# Patient Record
Sex: Male | Born: 1950 | Race: Black or African American | Hispanic: No | Smoking: Former smoker
Health system: Southern US, Community
[De-identification: ages and names within clinical notes are randomized; demographics above are authoritative.]

## PROBLEM LIST (undated history)

## (undated) DIAGNOSIS — E785 Hyperlipidemia, unspecified: Secondary | ICD-10-CM

## (undated) DIAGNOSIS — K4090 Unilateral inguinal hernia, without obstruction or gangrene, not specified as recurrent: Secondary | ICD-10-CM

## (undated) DIAGNOSIS — I10 Essential (primary) hypertension: Secondary | ICD-10-CM

## (undated) DIAGNOSIS — K219 Gastro-esophageal reflux disease without esophagitis: Secondary | ICD-10-CM

## (undated) DIAGNOSIS — Z8711 Personal history of peptic ulcer disease: Secondary | ICD-10-CM

## (undated) DIAGNOSIS — D649 Anemia, unspecified: Secondary | ICD-10-CM

## (undated) HISTORY — DX: Anemia, unspecified: D64.9

## (undated) HISTORY — DX: Hyperlipidemia, unspecified: E78.5

## (undated) HISTORY — PX: EYE SURGERY: SHX253

## (undated) HISTORY — DX: Essential (primary) hypertension: I10

## (undated) HISTORY — PX: GANGLION CYST EXCISION: SHX1691

---

## 2000-11-25 ENCOUNTER — Ambulatory Visit (HOSPITAL_COMMUNITY): Admission: RE | Admit: 2000-11-25 | Discharge: 2000-11-25 | Payer: Self-pay | Admitting: *Deleted

## 2002-05-25 ENCOUNTER — Inpatient Hospital Stay (HOSPITAL_COMMUNITY): Admission: EM | Admit: 2002-05-25 | Discharge: 2002-05-28 | Payer: Self-pay | Admitting: Emergency Medicine

## 2002-05-25 ENCOUNTER — Encounter: Payer: Self-pay | Admitting: Family Medicine

## 2014-12-11 NOTE — Progress Notes (Signed)
Please put orders in Epic surgery 12-27-14 pre op 12-20-14 Thanks

## 2014-12-12 ENCOUNTER — Ambulatory Visit: Payer: Self-pay | Admitting: General Surgery

## 2014-12-18 NOTE — Patient Instructions (Addendum)
YOUR PROCEDURE IS SCHEDULED ON : 12/27/14  REPORT TO [facility] MAIN ENTRANCE FOLLOW SIGNS TO EAST ELEVATOR - GO TO 3rd FLOOR CHECK IN AT 3 EAST NURSES STATION (SHORT STAY) AT: 7:30 AM  CALL THIS NUMBER IF YOU HAVE PROBLEMS THE MORNING OF SURGERY [phone]  REMEMBER:ONLY 1 PER PERSON MAY GO TO SHORT STAY WITH YOU TO GET READY THE MORNING OF YOUR SURGERY  DO NOT EAT FOOD OR DRINK LIQUIDS AFTER MIDNIGHT  TAKE THESE MEDICINES THE MORNING OF SURGERY: OMEPRAZOLE  STOP ASPIRIN / IBUPROFEN / ALEVE / VITAMINS / HERBAL MEDS __5__ DAYS BEFORE SURGERY  YOU MAY NOT HAVE ANY METAL ON YOUR BODY INCLUDING HAIR PINS AND PIERCING'S. DO NOT WEAR JEWELRY, MAKEUP, LOTIONS, POWDERS OR PERFUMES. DO NOT WEAR NAIL POLISH. DO NOT SHAVE 48 HRS PRIOR TO SURGERY. MEN MAY SHAVE FACE AND NECK.  DO NOT [place]. [facility] IS NOT RESPONSIBLE FOR VALUABLES.  CONTACTS, DENTURES OR PARTIALS MAY NOT BE WORN TO SURGERY. LEAVE SUITCASE IN CAR. CAN BE BROUGHT TO ROOM AFTER SURGERY.  PATIENTS DISCHARGED THE DAY OF SURGERY WILL NOT BE ALLOWED TO DRIVE HOME.  PLEASE READ OVER THE FOLLOWING INSTRUCTION SHEETS _________________________________________________________________________________                                          [place] - PREPARING FOR SURGERY  Before surgery, you can play an important role.  Because skin is not sterile, your skin needs to be as free of germs as possible.  You can reduce the number of germs on your skin by washing with CHG (chlorahexidine gluconate) soap before surgery.  CHG is an antiseptic cleaner which kills germs and bonds with the skin to continue killing germs even after washing. Please DO NOT use if you have an allergy to CHG or antibacterial soaps.  If your skin becomes reddened/irritated stop using the CHG and inform your nurse when you arrive at Short Stay. Do not shave (including legs and underarms) for at least 48 hours prior to  the first CHG shower.  You may shave your face. Please follow these instructions carefully:   1.  Shower with CHG Soap the night before surgery and the  morning of Surgery.   2.  If you choose to wash your hair, wash your hair first as usual with your  normal  Shampoo.   3.  After you shampoo, rinse your hair and body thoroughly to remove the  shampoo.                                         4.  Use CHG as you would any other liquid soap.  You can apply chg directly  to the skin and wash . Gently wash with scrungie or clean wascloth    5.  Apply the CHG Soap to your body ONLY FROM THE NECK DOWN.   Do not use on open                           Wound or open sores. Avoid contact with eyes, ears mouth and genitals (private parts).  Genitals (private parts) with your normal soap.              6.  Wash thoroughly, paying special attention to the area where your surgery  will be performed.   7.  Thoroughly rinse your body with warm water from the neck down.   8.  DO NOT shower/wash with your normal soap after using and rinsing off  the CHG Soap .                9.  Pat yourself dry with a clean towel.             10.  Wear clean night clothes to bed after shower             11.  Place clean sheets on your bed the night of your first shower and do not  sleep with pets.  Day of Surgery : Do not apply any lotions/deodorants the morning of surgery.  Please wear clean clothes to the hospital/surgery center.  FAILURE TO FOLLOW THESE INSTRUCTIONS MAY RESULT IN THE CANCELLATION OF YOUR SURGERY    PATIENT SIGNATURE_________________________________  ______________________________________________________________________

## 2014-12-20 ENCOUNTER — Encounter (HOSPITAL_COMMUNITY): Payer: Self-pay

## 2014-12-20 ENCOUNTER — Encounter (HOSPITAL_COMMUNITY)
Admission: RE | Admit: 2014-12-20 | Discharge: 2014-12-20 | Disposition: A | Discharge status: Home / Self Care | Payer: Medicare Other | Source: Ambulatory Visit | Attending: General Surgery | Admitting: General Surgery

## 2014-12-20 DIAGNOSIS — Z01818 Encounter for other preprocedural examination: Secondary | ICD-10-CM | POA: Diagnosis present

## 2014-12-20 DIAGNOSIS — K4090 Unilateral inguinal hernia, without obstruction or gangrene, not specified as recurrent: Secondary | ICD-10-CM | POA: Diagnosis not present

## 2014-12-20 HISTORY — DX: Unilateral inguinal hernia, without obstruction or gangrene, not specified as recurrent: K40.90

## 2014-12-20 HISTORY — DX: Personal history of peptic ulcer disease: Z87.11

## 2014-12-20 HISTORY — DX: Gastro-esophageal reflux disease without esophagitis: K21.9

## 2014-12-20 LAB — CBC
HEMATOCRIT: 40 % (ref 39.0–52.0)
HEMOGLOBIN: 13.6 g/dL (ref 13.0–17.0)
MCH: 30.6 pg (ref 26.0–34.0)
MCHC: 34 g/dL (ref 30.0–36.0)
MCV: 89.9 fL (ref 78.0–100.0)
Platelets: 154 10*3/uL (ref 150–400)
RBC: 4.45 MIL/uL (ref 4.22–5.81)
RDW: 12.9 % (ref 11.5–15.5)
WBC: 3.7 10*3/uL — ABNORMAL LOW (ref 4.0–10.5)

## 2014-12-27 ENCOUNTER — Ambulatory Visit (HOSPITAL_COMMUNITY)
Admission: RE | Admit: 2014-12-27 | Discharge: 2014-12-27 | Disposition: A | Discharge status: Home / Self Care | Payer: Medicare Other | Source: Ambulatory Visit | Attending: General Surgery | Admitting: General Surgery

## 2014-12-27 ENCOUNTER — Encounter (HOSPITAL_COMMUNITY): Payer: Self-pay | Admitting: *Deleted

## 2014-12-27 ENCOUNTER — Encounter (HOSPITAL_COMMUNITY)
Admission: RE | Disposition: A | Discharge status: Home / Self Care | Payer: Self-pay | Source: Ambulatory Visit | Attending: General Surgery

## 2014-12-27 ENCOUNTER — Ambulatory Visit (HOSPITAL_COMMUNITY): Payer: Medicare Other | Admitting: Anesthesiology

## 2014-12-27 DIAGNOSIS — Z79899 Other long term (current) drug therapy: Secondary | ICD-10-CM | POA: Insufficient documentation

## 2014-12-27 DIAGNOSIS — K219 Gastro-esophageal reflux disease without esophagitis: Secondary | ICD-10-CM | POA: Insufficient documentation

## 2014-12-27 DIAGNOSIS — K4090 Unilateral inguinal hernia, without obstruction or gangrene, not specified as recurrent: Secondary | ICD-10-CM | POA: Diagnosis present

## 2014-12-27 DIAGNOSIS — Z87891 Personal history of nicotine dependence: Secondary | ICD-10-CM | POA: Diagnosis not present

## 2014-12-27 HISTORY — PX: INGUINAL HERNIA REPAIR: SHX194

## 2014-12-27 HISTORY — PX: INSERTION OF MESH: SHX5868

## 2014-12-27 SURGERY — REPAIR, HERNIA, INGUINAL, LAPAROSCOPIC
Anesthesia: General | Site: Abdomen | Laterality: Right

## 2014-12-27 MED ORDER — CHLORHEXIDINE GLUCONATE 4 % EX LIQD: 1.0000 "application " | Freq: Once | CUTANEOUS | Status: DC

## 2014-12-27 MED ORDER — FENTANYL CITRATE (PF) 100 MCG/2ML IJ SOLN
INTRAMUSCULAR | Status: DC | PRN
  Administered 2014-12-27 (×3): 50 ug via INTRAVENOUS
  Administered 2014-12-27: 100 ug via INTRAVENOUS

## 2014-12-27 MED ORDER — ACETAMINOPHEN 325 MG PO TABS: 650.0000 mg | ORAL_TABLET | ORAL | Status: DC | PRN

## 2014-12-27 MED ORDER — SODIUM CHLORIDE 0.9 % IV SOLN: 250.0000 mL | INTRAVENOUS | Status: DC | PRN

## 2014-12-27 MED ORDER — PROMETHAZINE HCL 25 MG/ML IJ SOLN
6.2500 mg | INTRAMUSCULAR | Status: DC | PRN
Start: 2014-12-27 — End: 2014-12-27

## 2014-12-27 MED ORDER — MIDAZOLAM HCL 2 MG/2ML IJ SOLN
INTRAMUSCULAR | Status: AC
  Filled 2014-12-27: qty 4

## 2014-12-27 MED ORDER — 0.9 % SODIUM CHLORIDE (POUR BTL) OPTIME
TOPICAL | Status: DC | PRN
  Administered 2014-12-27: 1000 mL

## 2014-12-27 MED ORDER — KETOROLAC TROMETHAMINE 30 MG/ML IJ SOLN: 30.0000 mg | Freq: Once | INTRAMUSCULAR | Status: DC

## 2014-12-27 MED ORDER — CEFAZOLIN SODIUM-DEXTROSE 2-3 GM-% IV SOLR
2.0000 g | INTRAVENOUS | Status: AC
  Administered 2014-12-27: 2 g via INTRAVENOUS

## 2014-12-27 MED ORDER — SUGAMMADEX SODIUM 500 MG/5ML IV SOLN
INTRAVENOUS | Status: DC | PRN
  Administered 2014-12-27: 400 mg via INTRAVENOUS

## 2014-12-27 MED ORDER — SODIUM CHLORIDE 0.9 % IJ SOLN: 3.0000 mL | INTRAMUSCULAR | Status: DC | PRN

## 2014-12-27 MED ORDER — HYDROMORPHONE HCL 1 MG/ML IJ SOLN: 0.2500 mg | INTRAMUSCULAR | Status: DC | PRN

## 2014-12-27 MED ORDER — SODIUM CHLORIDE 0.9 % IJ SOLN: 3.0000 mL | Freq: Two times a day (BID) | INTRAMUSCULAR | Status: DC

## 2014-12-27 MED ORDER — MIDAZOLAM HCL 5 MG/5ML IJ SOLN
INTRAMUSCULAR | Status: DC | PRN
  Administered 2014-12-27: 2 mg via INTRAVENOUS

## 2014-12-27 MED ORDER — DEXAMETHASONE SODIUM PHOSPHATE 10 MG/ML IJ SOLN
INTRAMUSCULAR | Status: AC
  Filled 2014-12-27: qty 1

## 2014-12-27 MED ORDER — MORPHINE SULFATE (PF) 10 MG/ML IV SOLN: 1.0000 mg | INTRAVENOUS | Status: DC | PRN

## 2014-12-27 MED ORDER — DEXAMETHASONE SODIUM PHOSPHATE 10 MG/ML IJ SOLN
INTRAMUSCULAR | Status: DC | PRN
  Administered 2014-12-27: 10 mg via INTRAVENOUS

## 2014-12-27 MED ORDER — SODIUM CHLORIDE 0.9 % IV SOLN: INTRAVENOUS | Status: DC

## 2014-12-27 MED ORDER — CEFAZOLIN SODIUM-DEXTROSE 2-3 GM-% IV SOLR
INTRAVENOUS | Status: AC
  Filled 2014-12-27: qty 50

## 2014-12-27 MED ORDER — SUGAMMADEX SODIUM 500 MG/5ML IV SOLN
INTRAVENOUS | Status: AC
  Filled 2014-12-27: qty 5

## 2014-12-27 MED ORDER — ACETAMINOPHEN 650 MG RE SUPP
650.0000 mg | RECTAL | Status: DC | PRN
Start: 2014-12-27 — End: 2014-12-27
  Filled 2014-12-27: qty 1

## 2014-12-27 MED ORDER — OXYCODONE HCL 5 MG PO TABS: 5.0000 mg | ORAL_TABLET | ORAL | Status: DC | PRN

## 2014-12-27 MED ORDER — GLYCOPYRROLATE 0.2 MG/ML IJ SOLN
INTRAMUSCULAR | Status: AC
  Filled 2014-12-27: qty 3

## 2014-12-27 MED ORDER — ONDANSETRON HCL 4 MG/2ML IJ SOLN
INTRAMUSCULAR | Status: AC
  Filled 2014-12-27: qty 2

## 2014-12-27 MED ORDER — LIDOCAINE HCL (PF) 2 % IJ SOLN
INTRAMUSCULAR | Status: DC | PRN
  Administered 2014-12-27: 30 mg via INTRADERMAL

## 2014-12-27 MED ORDER — FENTANYL CITRATE (PF) 250 MCG/5ML IJ SOLN
INTRAMUSCULAR | Status: AC
  Filled 2014-12-27: qty 25

## 2014-12-27 MED ORDER — BUPIVACAINE-EPINEPHRINE 0.25% -1:200000 IJ SOLN
INTRAMUSCULAR | Status: DC | PRN
  Administered 2014-12-27: 50 mL

## 2014-12-27 MED ORDER — PROPOFOL 10 MG/ML IV BOLUS
INTRAVENOUS | Status: DC | PRN
Start: 2014-12-27 — End: 2014-12-27
  Administered 2014-12-27: 150 mg via INTRAVENOUS

## 2014-12-27 MED ORDER — PROPOFOL 10 MG/ML IV BOLUS
INTRAVENOUS | Status: AC
  Filled 2014-12-27: qty 20

## 2014-12-27 MED ORDER — LACTATED RINGERS IV SOLN
INTRAVENOUS | Status: DC
  Administered 2014-12-27: 09:00:00 via INTRAVENOUS

## 2014-12-27 MED ORDER — ROCURONIUM BROMIDE 100 MG/10ML IV SOLN
INTRAVENOUS | Status: DC | PRN
  Administered 2014-12-27: 50 mg via INTRAVENOUS
  Administered 2014-12-27 (×2): 10 mg via INTRAVENOUS

## 2014-12-27 MED ORDER — BUPIVACAINE-EPINEPHRINE (PF) 0.25% -1:200000 IJ SOLN
INTRAMUSCULAR | Status: AC
  Filled 2014-12-27: qty 60

## 2014-12-27 SURGICAL SUPPLY — 41 items
ADH SKN CLS APL DERMABOND .7 (GAUZE/BANDAGES/DRESSINGS) ×1
APL SKNCLS STERI-STRIP NONHPOA (GAUZE/BANDAGES/DRESSINGS)
BANDAGE ADH SHEER 1  50/CT (GAUZE/BANDAGES/DRESSINGS) IMPLANT
BENZOIN TINCTURE PRP APPL 2/3 (GAUZE/BANDAGES/DRESSINGS) IMPLANT
CABLE HIGH FREQUENCY MONO STRZ (ELECTRODE) ×1 IMPLANT
COVER SURGICAL LIGHT HANDLE (MISCELLANEOUS) ×2 IMPLANT
DECANTER SPIKE VIAL GLASS SM (MISCELLANEOUS) ×2 IMPLANT
DERMABOND ADVANCED (GAUZE/BANDAGES/DRESSINGS) ×1
DERMABOND ADVANCED .7 DNX12 (GAUZE/BANDAGES/DRESSINGS) IMPLANT
DEVICE SECURE STRAP 25 ABSORB (INSTRUMENTS) ×1 IMPLANT
DRAPE CAMERA CLOSED 9X96 (DRAPES) ×1 IMPLANT
DRAPE LAPAROSCOPIC ABDOMINAL (DRAPES) ×2 IMPLANT
DRSG TEGADERM 2-3/8X2-3/4 SM (GAUZE/BANDAGES/DRESSINGS) IMPLANT
DRSG TEGADERM 4X4.75 (GAUZE/BANDAGES/DRESSINGS) IMPLANT
ELECT REM PT RETURN 9FT ADLT (ELECTROSURGICAL) ×2
ELECTRODE REM PT RTRN 9FT ADLT (ELECTROSURGICAL) ×1 IMPLANT
GLOVE BIO SURGEON STRL SZ7.5 (GLOVE) ×2 IMPLANT
GLOVE BIOGEL M STRL SZ7.5 (GLOVE) IMPLANT
GLOVE INDICATOR 8.0 STRL GRN (GLOVE) ×2 IMPLANT
GOWN STRL REUS W/TWL XL LVL3 (GOWN DISPOSABLE) ×6 IMPLANT
KIT BASIN OR (CUSTOM PROCEDURE TRAY) ×2 IMPLANT
MESH 3DMAX LIGHT 4.1X6.2 RT LR (Mesh General) ×1 IMPLANT
NS IRRIG 1000ML POUR BTL (IV SOLUTION) ×2 IMPLANT
RELOAD STAPLE 4.0 BLU F/HERNIA (INSTRUMENTS) IMPLANT
RELOAD STAPLE 4.8 BLK F/HERNIA (STAPLE) IMPLANT
RELOAD STAPLE HERNIA 4.0 BLUE (INSTRUMENTS) IMPLANT
RELOAD STAPLE HERNIA 4.8 BLK (STAPLE) IMPLANT
SCALPEL HARMONIC ACE (MISCELLANEOUS) IMPLANT
SCISSORS LAP 5X35 DISP (ENDOMECHANICALS) ×1 IMPLANT
SET IRRIG TUBING LAPAROSCOPIC (IRRIGATION / IRRIGATOR) IMPLANT
SOLUTION ANTI FOG 6CC (MISCELLANEOUS) ×1 IMPLANT
STAPLER HERNIA 12 8.5 360D (INSTRUMENTS) ×2 IMPLANT
STRIP CLOSURE SKIN 1/2X4 (GAUZE/BANDAGES/DRESSINGS) IMPLANT
SUT MNCRL AB 4-0 PS2 18 (SUTURE) ×2 IMPLANT
TOWEL OR 17X26 10 PK STRL BLUE (TOWEL DISPOSABLE) ×2 IMPLANT
TOWEL OR NON WOVEN STRL DISP B (DISPOSABLE) ×2 IMPLANT
TRAY LAPAROSCOPIC (CUSTOM PROCEDURE TRAY) ×2 IMPLANT
TROCAR BLADELESS OPT 5 75 (ENDOMECHANICALS) ×2 IMPLANT
TROCAR SLEEVE XCEL 5X75 (ENDOMECHANICALS) ×2 IMPLANT
TROCAR XCEL BLUNT TIP 100MML (ENDOMECHANICALS) ×2 IMPLANT
TUBING INSUFFLATION 10FT LAP (TUBING) ×2 IMPLANT

## 2014-12-27 NOTE — Op Note (Signed)
12/27/2014  [place]. Jul 27, 1950   PREOPERATIVE DIAGNOSIS: right inguinal hernia.   POSTOPERATIVE DIAGNOSIS: large right indirect inguinal hernia.   PROCEDURE: Laparoscopic repair of large right indirect inguinal hernia with  mesh (TAPP).   SURGEON: Leighton Ruff. Redmond Pulling, MD   ASSISTANT SURGEON: None.   ANESTHESIA: General plus local consisting of 0.25% Marcaine with epi.   ESTIMATED BLOOD LOSS: Minimal.   FINDINGS: The patient had a large right indirect inguinal hernia with large sac.  It was repaired using a right Large 3DMax Bard Mesh  SPECIMEN: none  INDICATIONS FOR PROCEDURE: 64 yo male with symptomatic right inguinal hernia who desired repair. Please see h&p for addl details.  The risks and benefits including but not limited to bleeding, infection, chronic inguinal pain, nerve entrapment, hernia recurrence, mesh complications, hematoma formation, urinary retention, injury to the testicles or the ovaries, numbness in the groin, blood clots, injury to the surrounding structures, and anesthesia risk was discussed with the patient.  DESCRIPTION OF PROCEDURE: After obtaining verbal consent and marking  the right groin in the holding area with the patient confirming the  operative site, the patient was then taken back to the operating room, placed  supine on the operating room table. General endotracheal anesthesia was  established. The patient had emptied their bladder prior to going back to  the operating room. Sequential compression devices were placed. The  abdomen and groin were prepped and draped in the usual standard surgical  fashion with ChloraPrep. The patient received IV  antibiotics prior to the incision. A surgical time-out was performed.  Local was infiltrated at the base of the umbilicus.   Next, a 1-cm vertical infraumbilical incision was made with a #11 blade. The fascia  was grasped and lifted anteriorly. Next, the fascia was incised, and  the abdominal  cavity was entered. Pursestring suture was placed around  the fascial edges using a 0 Vicryl. A 12-mm Hasson trocar was placed.  Pneumoperitoneum was smoothly established up to a patient pressure of 15  mmHg. Laparoscope was advanced. There was no evidence of a  contralateral hernia. The patient had a defect lateral to  the inferior epigastric vessel, consistent with an right indirect  hernia. Two 5-mm trocars were placed, one on the right, one on the left  in the midclavicular line slightly above the level of the umbilicus all  under direct visualization. After local had been infiltrated, I then  made incision along the peritoneum on the right, starting 2 inches above  the anterior superior iliac spine and caring it medial  toward the median umbilical ligament in a lazy S configuration using  Endo Shears with electrocautery. The peritoneal flap was then gently  dissected downward from the anterior abdominal wall taking care not to  injure the inferior epigastric vessels. He had somewhat of a convexity to his lower abdominal wall that made it a little challenging to see where the upper portion of the abdominal wall where the flap had been taken down from. The pubic bone was identified. The testicular vessels were identified.  Using  traction and counter traction with short graspers, I reduced the sac in  its entirety. It took about 25 minutes to reduce this large sac. The testicular vessels had been identified and preserved. The vas deferens was identified and preserved, and the hernia sac was stripped from those to  surrounding structures. I then went about creating a large pocket by  lifting the peritoneum of the pelvic floor. I  took great care not to  injure the iliac vessels.    Local anesthetic was injected 2 finger breadths below and medial to the anterior superior iliac spine as well as along the right groin prior to placing the mesh. I then obtained a large piece of Bard 3DMax mesh,  placed it through the Hasson trocar, half of it covered medial  to the inferior epigastric vessels and half of it lateral to the  inferior epigastric vessels. The defect was well  covered with the mesh. Again because of the shape of his lower abdominal wall, I had to change the camera angle several times to see upper edge of mesh.  I then secured the mesh to the abdominal wall  using an Ethicon secure strap tack. Tacks were placed through on upper medial side of mesh, one tack on each side of the inferior epigastric  vessel and two tacks out laterally. No tacks were placed below the  shelving edge of the inguinal ligament. Pneumoperitoneum was reduced  to 8 mmHg. I then brought the peritoneal flap back up to the abdominal  wall and tacked it to the abdominal wall using 3 tacks. There was no  defect in the peritoneum, and the mesh was well covered. I removed the  Hasson trocar and tied down the previously placed pursestring suture.  The closure was viewed laparoscopically. There was no evidence of  fascial defect. There was no air leak at the umbilicus. There was no  evidence of injury to surrounding structures. Pneumoperitoneum was  released, and the remaining trocars were removed. All skin incisions  were closed with a 4-0 Monocryl in a subcuticular fashion followed by  application of Dermabond. All needle, instrument, and sponge counts  were correct x2. There are no immediate complications. The patient  tolerated the procedure well. The patient was extubated and taken to the  recovery room in stable condition.  Leighton Ruff. Redmond Pulling, MD, FACS General, Bariatric, & Minimally Invasive Surgery [facility], [place]

## 2014-12-27 NOTE — Anesthesia Postprocedure Evaluation (Signed)
  Anesthesia Post-op Note  Patient: Austin Owens.  Procedure(s) Performed: Procedure(s) (LRB): LAPAROSCOPIC RIGHT INGUINAL HERNIA REPAIR WITH MESH (Right) INSERTION OF MESH (Right)  Patient Location: PACU  Anesthesia Type: General  Level of Consciousness: awake and alert   Airway and Oxygen Therapy: Patient Spontanous Breathing  Post-op Pain: mild  Post-op Assessment: Post-op Vital signs reviewed, Patient's Cardiovascular Status Stable, Respiratory Function Stable, Patent Airway and No signs of Nausea or vomiting  Last Vitals:  Filed Vitals:   12/27/14 1202  BP: 132/81  Pulse: 72  Temp: 36.6 C  Resp: 16    Post-op Vital Signs: stable   Complications: No apparent anesthesia complications

## 2014-12-27 NOTE — H&P (Signed)
Austin Dash. is an 64 y.o. male.   Chief Complaint: here for surgery HPI: The patient is a 64 year old male who presents with an inguinal hernia. He is referred by Dr Criss Rosales for evaluation of an inguinal hernia. He states that it only has been present for a couple weeks. It does cause some intermittent discomfort but he denies any sharp, burning, stinging pain. He denies any nausea or vomiting. He reports daily bowel movements. He denies any difficulty urinating. He denies any pain with urination. He is quite talkative and verbose. He denies any unexplained weight change. He denies any chest pain, chest pressure, shortness of breath, dyspnea on exertion TIAs, or amaurosis fugax.  He has been taking his flomax except for this am. He denies any changes since I saw him in the clinic.  Past Medical History  Diagnosis Date  . History of bleeding ulcers     x3  . GERD (gastroesophageal reflux disease)   . Inguinal hernia     Past Surgical History  Procedure Laterality Date  . Eye surgery      x7  . Ganglion cyst excision      left hand    History reviewed. No pertinent family history. Social History:  reports that he quit smoking about 45 years ago. He does not have any smokeless tobacco history on file. He reports that he does not drink alcohol or use illicit drugs.  Allergies: No Known Allergies  Medications Prior to Admission  Medication Sig Dispense Refill  . acetaminophen (TYLENOL) 500 MG tablet Take 500 mg by mouth every 6 (six) hours as needed for mild pain or headache.    . Multiple Vitamin (MULTIVITAMIN WITH MINERALS) TABS tablet Take 1 tablet by mouth daily.    . naproxen sodium (ANAPROX) 220 MG tablet Take 220 mg by mouth 2 (two) times daily with a meal.    . omeprazole (PRILOSEC) 20 MG capsule Take 20 mg by mouth daily.    . tamsulosin (FLOMAX) 0.4 MG CAPS capsule TAKE ONE CAPSULE BY MOUTH EVERY DAY, START TAKING 2 WEEKS BEFORE SURGERY  0    No results found for  this or any previous visit (from the past 77 hour(s)). No results found.  Review of Systems  Constitutional: Negative for weight loss.  HENT: Negative for nosebleeds.   Eyes: Negative for blurred vision.  Respiratory: Negative for shortness of breath.   Cardiovascular: Negative for chest pain, palpitations, orthopnea and PND.       Denies DOE  Genitourinary: Negative for dysuria and hematuria.  Musculoskeletal: Negative.   Skin: Negative for itching and rash.  Neurological: Negative for dizziness, focal weakness, seizures, loss of consciousness and headaches.       Denies TIAs, amaurosis fugax  Endo/Heme/Allergies: Does not bruise/bleed easily.  Psychiatric/Behavioral: The patient is not nervous/anxious.     Blood pressure 139/85, pulse 74, temperature 98.1 F (36.7 C), temperature source Oral, resp. rate 18, height 5' 11.75" (1.822 m), weight 98.431 kg (217 lb), SpO2 100 %. Physical Exam  Vitals reviewed. Constitutional: He is oriented to person, place, and time. He appears well-developed and well-nourished. No distress.  HENT:  Head: Normocephalic and atraumatic.  Right Ear: External ear normal.  Left Ear: External ear normal.  Eyes: Conjunctivae are normal. No scleral icterus.  Neck: Normal range of motion. Neck supple. No tracheal deviation present. No thyromegaly present.  Cardiovascular: Normal rate and normal heart sounds.   Respiratory: Effort normal and breath sounds normal.  No stridor. No respiratory distress. He has no wheezes.  GI: Soft. He exhibits no distension. There is no tenderness. There is no rebound.  Genitourinary:  Right groin bulge  Musculoskeletal: He exhibits no edema or tenderness.  Lymphadenopathy:    He has no cervical adenopathy.  Neurological: He is alert and oriented to person, place, and time. He exhibits normal muscle tone.  Skin: Skin is warm and dry. No rash noted. He is not diaphoretic. No erythema. No pallor.  Psychiatric: He has a normal  mood and affect. His behavior is normal. Judgment and thought content normal.     Assessment/Plan Right inguinal hernia  To OR for lap repair of RIH with mesh All questions asked and answered.   Leighton Ruff. Redmond Pulling, MD, FACS General, Bariatric, & Minimally Invasive Surgery [facility], [place]   [facility] M 12/27/2014, 7:25 AM

## 2014-12-27 NOTE — Anesthesia Procedure Notes (Signed)
Procedure Name: Intubation Date/Time: 12/27/2014 9:40 AM Performed by: Lajuana Carry E Pre-anesthesia Checklist: Patient identified, Emergency Drugs available, Suction available and Patient being monitored Patient Re-evaluated:Patient Re-evaluated prior to inductionOxygen Delivery Method: Circle System Utilized Preoxygenation: Pre-oxygenation with 100% oxygen Intubation Type: IV induction Ventilation: Mask ventilation without difficulty Laryngoscope Size: Miller and 3 Grade View: Grade I Tube type: Oral Number of attempts: 1 Airway Equipment and Method: Stylet Placement Confirmation: ETT inserted through vocal cords under direct vision,  positive ETCO2 and breath sounds checked- equal and bilateral Secured at: 23 cm Tube secured with: Tape Dental Injury: Teeth and Oropharynx as per pre-operative assessment

## 2014-12-27 NOTE — Anesthesia Preprocedure Evaluation (Signed)
Anesthesia Evaluation  Patient identified by MRN, date of birth, ID band Patient awake    Reviewed: Allergy & Precautions, NPO status , Patient's Chart, lab work & pertinent test results  Airway Mallampati: II  TM Distance: >3 FB Neck ROM: Full    Dental no notable dental hx.    Pulmonary neg pulmonary ROS, former smoker,    Pulmonary exam normal breath sounds clear to auscultation       Cardiovascular negative cardio ROS Normal cardiovascular exam Rhythm:Regular Rate:Normal     Neuro/Psych negative neurological ROS  negative psych ROS   GI/Hepatic Neg liver ROS, GERD  Medicated,  Endo/Other  negative endocrine ROS  Renal/GU negative Renal ROS  negative genitourinary   Musculoskeletal negative musculoskeletal ROS (+)   Abdominal   Peds negative pediatric ROS (+)  Hematology negative hematology ROS (+)   Anesthesia Other Findings   Reproductive/Obstetrics negative OB ROS                             Anesthesia Physical Anesthesia Plan  ASA: II  Anesthesia Plan: General   Post-op Pain Management:    Induction: Intravenous  Airway Management Planned: Oral ETT  Additional Equipment:   Intra-op Plan:   Post-operative Plan: Extubation in OR  Informed Consent: I have reviewed the patients History and Physical, chart, labs and discussed the procedure including the risks, benefits and alternatives for the proposed anesthesia with the patient or authorized representative who has indicated his/her understanding and acceptance.   Dental advisory given  Plan Discussed with: CRNA and Surgeon  Anesthesia Plan Comments:         Anesthesia Quick Evaluation

## 2014-12-27 NOTE — Discharge Instructions (Signed)
[facility] ° °UMBILICAL OR INGUINAL HERNIA REPAIR: POST OP INSTRUCTIONS ° °Always review your discharge instruction sheet given to you by the facility where your surgery was performed. °IF YOU HAVE DISABILITY OR FAMILY LEAVE FORMS, YOU MUST BRING THEM TO THE OFFICE FOR PROCESSING.   °DO NOT GIVE THEM TO YOUR DOCTOR. ° °1. A  prescription for pain medication may be given to you upon discharge.  Take your pain medication as prescribed, if needed.  If narcotic pain medicine is not needed, then you may take acetaminophen (Tylenol) or ibuprofen (Advil) as needed. °2. Take your usually prescribed medications unless otherwise directed. °3. If you need a refill on your pain medication, please contact your pharmacy.  They will contact our office to request authorization. Prescriptions will not be filled after 5 pm or on week-ends. °4. You should follow a light diet the first 24 hours after arrival home, such as soup and crackers, etc.  Be sure to include lots of fluids daily.  Resume your normal diet the day after surgery. °5. Most patients will experience some swelling and bruising around the umbilicus or in the groin and scrotum.  Ice packs and reclining will help.  Swelling and bruising can take several days to resolve.  °6. It is common to experience some constipation if taking pain medication after surgery.  Increasing fluid intake and taking a stool softener (such as Colace) will usually help or prevent this problem from occurring.  A mild laxative (Milk of Magnesia or Miralax) should be taken according to package directions if there are no bowel movements after 48 hours. °7.   If your surgeon used skin glue on the incision, you may shower in 24 hours.  The glue will flake off over the next 2-3 weeks.  Any sutures or staples will be removed at the office during your follow-up visit. °8. ACTIVITIES:  You may resume regular (light) daily activities beginning the next day--such as daily self-care,  walking, climbing stairs--gradually increasing activities as tolerated.  You may have sexual intercourse when it is comfortable.  Refrain from any heavy lifting or straining until approved by your doctor. °a. You may drive when you are no longer taking prescription pain medication, you can comfortably wear a seatbelt, and you can safely maneuver your car and apply brakes. °b. RETURN TO WORK:  °9. You should see your doctor in the office for a follow-up appointment approximately 2-3 weeks after your surgery.  Make sure that you call for this appointment within a day or two after you arrive home to insure a convenient appointment time. °10. OTHER INSTRUCTIONS: DO NOT LIFT, PUSH, OR PULL ANYTHING GREATER THAN 10 POUNDS FOR 6 WEEKS  ° ° °WHEN TO CALL YOUR DOCTOR: °1. Fever over 101.0 °2. Inability to urinate °3. Nausea and/or vomiting °4. Extreme swelling or bruising °5. Continued bleeding from incision. °6. Increased pain, redness, or drainage from the incision ° °The clinic staff is available to answer your questions during regular business hours.  Please don’t hesitate to call and ask to speak to one of the nurses for clinical concerns.  If you have a medical emergency, go to the nearest emergency room or call 911.  A surgeon from [facility] is always on call at the hospital ° ° °[address], [address], [place], [place]  [address] ? ° [address] [address], [place],    [address] °[phone] ? [phone] ? [phone] [phone] °Web site: [email or web address] ° °

## 2014-12-27 NOTE — Transfer of Care (Signed)
Immediate Anesthesia Transfer of Care Note  Patient: Austin Owens.  Procedure(s) Performed: Procedure(s): LAPAROSCOPIC RIGHT INGUINAL HERNIA REPAIR WITH MESH (Right) INSERTION OF MESH (Right)  Patient Location: PACU  Anesthesia Type:General  Level of Consciousness:  sedated, patient cooperative and responds to stimulation  Airway & Oxygen Therapy:Patient Spontanous Breathing and Patient connected to face mask oxgen  Post-op Assessment:  Report given to PACU RN and Post -op Vital signs reviewed and stable  Post vital signs:  Reviewed and stable  Last Vitals:  Filed Vitals:   12/27/14 0653  BP: 139/85  Pulse: 74  Temp: 36.7 C  Resp: 18    Complications: No apparent anesthesia complications

## 2015-03-27 ENCOUNTER — Other Ambulatory Visit: Payer: Self-pay | Admitting: General Surgery

## 2015-03-27 DIAGNOSIS — S301XXS Contusion of abdominal wall, sequela: Secondary | ICD-10-CM

## 2015-03-29 ENCOUNTER — Ambulatory Visit
Admission: RE | Admit: 2015-03-29 | Discharge: 2015-03-29 | Disposition: A | Discharge status: Home / Self Care | Payer: Medicare Other | Source: Ambulatory Visit | Attending: General Surgery | Admitting: General Surgery

## 2015-03-29 DIAGNOSIS — M7981 Nontraumatic hematoma of soft tissue: Secondary | ICD-10-CM | POA: Diagnosis not present

## 2015-03-29 DIAGNOSIS — S301XXS Contusion of abdominal wall, sequela: Secondary | ICD-10-CM

## 2015-05-02 DIAGNOSIS — L80 Vitiligo: Secondary | ICD-10-CM | POA: Diagnosis not present

## 2015-07-02 DIAGNOSIS — Z6832 Body mass index (BMI) 32.0-32.9, adult: Secondary | ICD-10-CM | POA: Diagnosis not present

## 2015-07-02 DIAGNOSIS — R7309 Other abnormal glucose: Secondary | ICD-10-CM | POA: Diagnosis not present

## 2015-07-02 DIAGNOSIS — F99 Mental disorder, not otherwise specified: Secondary | ICD-10-CM | POA: Diagnosis not present

## 2015-07-02 DIAGNOSIS — R5383 Other fatigue: Secondary | ICD-10-CM | POA: Diagnosis not present

## 2015-07-15 DIAGNOSIS — K219 Gastro-esophageal reflux disease without esophagitis: Secondary | ICD-10-CM | POA: Diagnosis not present

## 2015-07-15 DIAGNOSIS — R1031 Right lower quadrant pain: Secondary | ICD-10-CM | POA: Diagnosis not present

## 2015-07-15 DIAGNOSIS — Z1211 Encounter for screening for malignant neoplasm of colon: Secondary | ICD-10-CM | POA: Diagnosis not present

## 2015-07-16 DIAGNOSIS — N400 Enlarged prostate without lower urinary tract symptoms: Secondary | ICD-10-CM | POA: Diagnosis not present

## 2015-07-16 DIAGNOSIS — Z0000 Encounter for general adult medical examination without abnormal findings: Secondary | ICD-10-CM | POA: Diagnosis not present

## 2015-07-25 DIAGNOSIS — Z1211 Encounter for screening for malignant neoplasm of colon: Secondary | ICD-10-CM | POA: Diagnosis not present

## 2015-07-25 DIAGNOSIS — K633 Ulcer of intestine: Secondary | ICD-10-CM | POA: Diagnosis not present

## 2015-07-25 DIAGNOSIS — D127 Benign neoplasm of rectosigmoid junction: Secondary | ICD-10-CM | POA: Diagnosis not present

## 2015-07-25 DIAGNOSIS — K635 Polyp of colon: Secondary | ICD-10-CM | POA: Diagnosis not present

## 2015-07-25 DIAGNOSIS — D122 Benign neoplasm of ascending colon: Secondary | ICD-10-CM | POA: Diagnosis not present

## 2015-10-21 DIAGNOSIS — M130 Polyarthritis, unspecified: Secondary | ICD-10-CM | POA: Diagnosis not present

## 2015-10-21 DIAGNOSIS — M545 Low back pain: Secondary | ICD-10-CM | POA: Diagnosis not present

## 2015-10-22 ENCOUNTER — Other Ambulatory Visit: Payer: Self-pay | Admitting: Family Medicine

## 2015-10-22 ENCOUNTER — Ambulatory Visit
Admission: RE | Admit: 2015-10-22 | Discharge: 2015-10-22 | Disposition: A | Discharge status: Home / Self Care | Payer: Medicare Other | Source: Ambulatory Visit | Attending: Family Medicine | Admitting: Family Medicine

## 2015-10-22 DIAGNOSIS — M47816 Spondylosis without myelopathy or radiculopathy, lumbar region: Secondary | ICD-10-CM | POA: Diagnosis not present

## 2015-10-22 DIAGNOSIS — M545 Low back pain: Secondary | ICD-10-CM

## 2015-10-22 DIAGNOSIS — M130 Polyarthritis, unspecified: Secondary | ICD-10-CM

## 2015-10-22 DIAGNOSIS — M461 Sacroiliitis, not elsewhere classified: Secondary | ICD-10-CM | POA: Diagnosis not present

## 2015-10-30 DIAGNOSIS — Z0000 Encounter for general adult medical examination without abnormal findings: Secondary | ICD-10-CM | POA: Diagnosis not present

## 2015-10-30 DIAGNOSIS — M461 Sacroiliitis, not elsewhere classified: Secondary | ICD-10-CM | POA: Diagnosis not present

## 2015-11-08 DIAGNOSIS — M461 Sacroiliitis, not elsewhere classified: Secondary | ICD-10-CM | POA: Diagnosis not present

## 2015-11-11 DIAGNOSIS — R069 Unspecified abnormalities of breathing: Secondary | ICD-10-CM | POA: Diagnosis not present

## 2015-11-13 DIAGNOSIS — M461 Sacroiliitis, not elsewhere classified: Secondary | ICD-10-CM | POA: Diagnosis not present

## 2015-11-14 DIAGNOSIS — M461 Sacroiliitis, not elsewhere classified: Secondary | ICD-10-CM | POA: Diagnosis not present

## 2015-11-20 DIAGNOSIS — M461 Sacroiliitis, not elsewhere classified: Secondary | ICD-10-CM | POA: Diagnosis not present

## 2015-11-21 DIAGNOSIS — M461 Sacroiliitis, not elsewhere classified: Secondary | ICD-10-CM | POA: Diagnosis not present

## 2015-11-25 DIAGNOSIS — M545 Low back pain: Secondary | ICD-10-CM | POA: Diagnosis not present

## 2015-11-25 DIAGNOSIS — M7620 Iliac crest spur, unspecified hip: Secondary | ICD-10-CM | POA: Diagnosis not present

## 2015-11-25 DIAGNOSIS — M130 Polyarthritis, unspecified: Secondary | ICD-10-CM | POA: Diagnosis not present

## 2015-11-26 DIAGNOSIS — M461 Sacroiliitis, not elsewhere classified: Secondary | ICD-10-CM | POA: Diagnosis not present

## 2015-11-28 DIAGNOSIS — M461 Sacroiliitis, not elsewhere classified: Secondary | ICD-10-CM | POA: Diagnosis not present

## 2015-12-03 DIAGNOSIS — M461 Sacroiliitis, not elsewhere classified: Secondary | ICD-10-CM | POA: Diagnosis not present

## 2015-12-05 DIAGNOSIS — M461 Sacroiliitis, not elsewhere classified: Secondary | ICD-10-CM | POA: Diagnosis not present

## 2016-01-06 DIAGNOSIS — M130 Polyarthritis, unspecified: Secondary | ICD-10-CM | POA: Diagnosis not present

## 2016-02-20 DIAGNOSIS — M25519 Pain in unspecified shoulder: Secondary | ICD-10-CM | POA: Diagnosis not present

## 2016-03-24 DIAGNOSIS — M25519 Pain in unspecified shoulder: Secondary | ICD-10-CM | POA: Diagnosis not present

## 2016-06-02 DIAGNOSIS — M545 Low back pain: Secondary | ICD-10-CM | POA: Diagnosis not present

## 2016-07-20 DIAGNOSIS — Z125 Encounter for screening for malignant neoplasm of prostate: Secondary | ICD-10-CM | POA: Diagnosis not present

## 2016-07-20 DIAGNOSIS — N400 Enlarged prostate without lower urinary tract symptoms: Secondary | ICD-10-CM | POA: Diagnosis not present

## 2016-11-04 DIAGNOSIS — M130 Polyarthritis, unspecified: Secondary | ICD-10-CM | POA: Diagnosis not present

## 2016-11-06 ENCOUNTER — Ambulatory Visit
Admission: RE | Admit: 2016-11-06 | Discharge: 2016-11-06 | Disposition: A | Discharge status: Home / Self Care | Payer: Medicare Other | Source: Ambulatory Visit | Attending: Family Medicine | Admitting: Family Medicine

## 2016-11-06 ENCOUNTER — Other Ambulatory Visit: Payer: Self-pay | Admitting: Family Medicine

## 2016-11-06 DIAGNOSIS — M19012 Primary osteoarthritis, left shoulder: Secondary | ICD-10-CM | POA: Diagnosis not present

## 2016-11-06 DIAGNOSIS — M130 Polyarthritis, unspecified: Secondary | ICD-10-CM

## 2016-11-06 DIAGNOSIS — M19011 Primary osteoarthritis, right shoulder: Secondary | ICD-10-CM | POA: Diagnosis not present

## 2016-12-08 DIAGNOSIS — M130 Polyarthritis, unspecified: Secondary | ICD-10-CM | POA: Diagnosis not present

## 2017-06-25 DIAGNOSIS — Z0000 Encounter for general adult medical examination without abnormal findings: Secondary | ICD-10-CM | POA: Diagnosis not present

## 2017-09-20 DIAGNOSIS — M79641 Pain in right hand: Secondary | ICD-10-CM | POA: Diagnosis not present

## 2017-10-06 DIAGNOSIS — Z6833 Body mass index (BMI) 33.0-33.9, adult: Secondary | ICD-10-CM | POA: Diagnosis not present

## 2017-10-06 DIAGNOSIS — M130 Polyarthritis, unspecified: Secondary | ICD-10-CM | POA: Diagnosis not present

## 2017-10-06 DIAGNOSIS — E119 Type 2 diabetes mellitus without complications: Secondary | ICD-10-CM | POA: Diagnosis not present

## 2017-10-06 DIAGNOSIS — I10 Essential (primary) hypertension: Secondary | ICD-10-CM | POA: Diagnosis not present

## 2017-10-06 DIAGNOSIS — R69 Illness, unspecified: Secondary | ICD-10-CM | POA: Diagnosis not present

## 2017-10-14 DIAGNOSIS — M79641 Pain in right hand: Secondary | ICD-10-CM | POA: Diagnosis not present

## 2017-10-14 DIAGNOSIS — G5601 Carpal tunnel syndrome, right upper limb: Secondary | ICD-10-CM | POA: Diagnosis not present

## 2017-10-29 DIAGNOSIS — I10 Essential (primary) hypertension: Secondary | ICD-10-CM | POA: Diagnosis not present

## 2017-11-24 ENCOUNTER — Encounter (HOSPITAL_COMMUNITY): Payer: Self-pay

## 2017-11-24 ENCOUNTER — Emergency Department (HOSPITAL_COMMUNITY)
Admission: EM | Admit: 2017-11-24 | Discharge: 2017-11-24 | Disposition: A | Discharge status: Home / Self Care | Payer: Medicare HMO | Attending: Emergency Medicine | Admitting: Emergency Medicine

## 2017-11-24 ENCOUNTER — Other Ambulatory Visit: Payer: Self-pay

## 2017-11-24 DIAGNOSIS — R2232 Localized swelling, mass and lump, left upper limb: Secondary | ICD-10-CM | POA: Diagnosis present

## 2017-11-24 DIAGNOSIS — Z87891 Personal history of nicotine dependence: Secondary | ICD-10-CM | POA: Diagnosis not present

## 2017-11-24 DIAGNOSIS — L089 Local infection of the skin and subcutaneous tissue, unspecified: Secondary | ICD-10-CM | POA: Insufficient documentation

## 2017-11-24 DIAGNOSIS — Z79899 Other long term (current) drug therapy: Secondary | ICD-10-CM | POA: Diagnosis not present

## 2017-11-24 DIAGNOSIS — L03012 Cellulitis of left finger: Secondary | ICD-10-CM | POA: Diagnosis not present

## 2017-11-24 DIAGNOSIS — S61012A Laceration without foreign body of left thumb without damage to nail, initial encounter: Secondary | ICD-10-CM | POA: Diagnosis not present

## 2017-11-24 DIAGNOSIS — M79645 Pain in left finger(s): Secondary | ICD-10-CM | POA: Diagnosis not present

## 2017-11-24 DIAGNOSIS — M7989 Other specified soft tissue disorders: Secondary | ICD-10-CM | POA: Diagnosis not present

## 2017-11-24 DIAGNOSIS — Z6832 Body mass index (BMI) 32.0-32.9, adult: Secondary | ICD-10-CM | POA: Diagnosis not present

## 2017-11-24 LAB — CBC WITH DIFFERENTIAL/PLATELET
Abs Immature Granulocytes: 0 10*3/uL (ref 0.0–0.1)
BASOS PCT: 0 %
Basophils Absolute: 0 10*3/uL (ref 0.0–0.1)
EOS PCT: 1 %
Eosinophils Absolute: 0.1 10*3/uL (ref 0.0–0.7)
HCT: 39.6 % (ref 39.0–52.0)
HEMOGLOBIN: 12.9 g/dL — AB (ref 13.0–17.0)
Immature Granulocytes: 0 %
Lymphocytes Relative: 25 %
Lymphs Abs: 1.6 10*3/uL (ref 0.7–4.0)
MCH: 29.8 pg (ref 26.0–34.0)
MCHC: 32.6 g/dL (ref 30.0–36.0)
MCV: 91.5 fL (ref 78.0–100.0)
MONO ABS: 0.9 10*3/uL (ref 0.1–1.0)
MONOS PCT: 14 %
NEUTROS ABS: 3.9 10*3/uL (ref 1.7–7.7)
Neutrophils Relative %: 60 %
Platelets: 172 10*3/uL (ref 150–400)
RBC: 4.33 MIL/uL (ref 4.22–5.81)
RDW: 12.6 % (ref 11.5–15.5)
WBC: 6.5 10*3/uL (ref 4.0–10.5)

## 2017-11-24 LAB — COMPREHENSIVE METABOLIC PANEL
ALBUMIN: 3.7 g/dL (ref 3.5–5.0)
ALT: 24 U/L (ref 0–44)
ANION GAP: 11 (ref 5–15)
AST: 29 U/L (ref 15–41)
Alkaline Phosphatase: 72 U/L (ref 38–126)
BILIRUBIN TOTAL: 0.9 mg/dL (ref 0.3–1.2)
BUN: 30 mg/dL — AB (ref 8–23)
CALCIUM: 9.4 mg/dL (ref 8.9–10.3)
CO2: 24 mmol/L (ref 22–32)
Chloride: 104 mmol/L (ref 98–111)
Creatinine, Ser: 1.43 mg/dL — ABNORMAL HIGH (ref 0.61–1.24)
GFR calc Af Amer: 56 mL/min — ABNORMAL LOW (ref >60)
GFR calc non Af Amer: 48 mL/min — ABNORMAL LOW (ref >60)
GLUCOSE: 126 mg/dL — AB (ref 70–99)
Potassium: 3.4 mmol/L — ABNORMAL LOW (ref 3.5–5.1)
SODIUM: 139 mmol/L (ref 135–145)
Total Protein: 7.5 g/dL (ref 6.5–8.1)

## 2017-11-24 LAB — I-STAT CG4 LACTIC ACID, ED: Lactic Acid, Venous: 0.94 mmol/L (ref 0.5–1.9)

## 2017-11-24 MED ORDER — LIDOCAINE HCL (PF) 1 % IJ SOLN
10.0000 mL | Freq: Once | INTRAMUSCULAR | Status: AC
  Administered 2017-11-24: 10 mL
  Filled 2017-11-24: qty 10

## 2017-11-24 MED ORDER — CEPHALEXIN 500 MG PO CAPS: 500.0000 mg | ORAL_CAPSULE | Freq: Four times a day (QID) | ORAL | 0 refills | Status: AC

## 2017-11-24 NOTE — ED Notes (Signed)
Patient verbalizes understanding of discharge instructions. Opportunity for questioning and answers were provided. Armband removed by staff, pt discharged from ED ambulatory.   

## 2017-11-24 NOTE — Discharge Instructions (Addendum)
Wash thumb twice daily with soap and water, pat dry.  Wrap with Xeroform then dry dressing.  Thumb skin will probably come off, that is ok.  Follow up with the hand doctor early next week. Call today to make that appointment.

## 2017-11-24 NOTE — Procedures (Signed)
Procedure: Left thumb I&D  Indication: Left thumb abscess  Surgeon: Silvestre Gunner, PA-C  Assist: None  Anesthesia: 31ml 1% plain lidocaine as digital block  EBL: None  Complications: None  Findings: After risks/benefits explained patient desires to undergo procedure. Consent obtained and time out performed. The left thumb was sterilely prepped and blocked. A longitudinal dorsal incison was made over the thumb, purulent discharge evacuated. Entire abscess contents appeared to be between epidermis and dermis. No fluctuance appreciated after superficial drainage. Decision made not to incise deeper. Thumb to be dressed with Xeroform. Pt tolerated the procedure well.    [name], PA-C Orthopedic Surgery [phone]

## 2017-11-24 NOTE — Consult Note (Signed)
Reason for Consult:Left thumb infection Referring Physician: A Wilber Oliphant. is an 67 y.o. male.  HPI: Austin Owens cut his thumb and index finger near the nail on a pipe last week. The index finger healed up fine but the thumb swelled up on Monday and is only getting worse. He denies any prior hx/o similar. Denies fevers, chills, sweats, N/V. He is RHD.  Past Medical History:  Diagnosis Date  . GERD (gastroesophageal reflux disease)   . History of bleeding ulcers    x3  . Inguinal hernia     Past Surgical History:  Procedure Laterality Date  . EYE SURGERY     x7  . GANGLION CYST EXCISION     left hand  . INGUINAL HERNIA REPAIR Right 12/27/2014   Procedure: LAPAROSCOPIC RIGHT INGUINAL HERNIA REPAIR WITH MESH;  Surgeon: Greer Pickerel, MD;  Location: [facility];  Service: General;  Laterality: Right;  . INSERTION OF MESH Right 12/27/2014   Procedure: INSERTION OF MESH;  Surgeon: Greer Pickerel, MD;  Location: [facility];  Service: General;  Laterality: Right;    No family history on file.  Social History:  reports that he quit smoking about 47 years ago. He has never used smokeless tobacco. He reports that he does not drink alcohol or use drugs.  Allergies: No Known Allergies  Medications: I have reviewed the patient's current medications.  Results for orders placed or performed during the hospital encounter of 11/24/17 (from the past 48 hour(s))  Comprehensive metabolic panel     Status: Abnormal   Collection Time: 11/24/17 11:43 AM  Result Value Ref Range   Sodium 139 135 - 145 mmol/L   Potassium 3.4 (L) 3.5 - 5.1 mmol/L   Chloride 104 98 - 111 mmol/L   CO2 24 22 - 32 mmol/L   Glucose, Bld 126 (H) 70 - 99 mg/dL   BUN 30 (H) 8 - 23 mg/dL   Creatinine, Ser 1.43 (H) 0.61 - 1.24 mg/dL   Calcium 9.4 8.9 - 10.3 mg/dL   Total Protein 7.5 6.5 - 8.1 g/dL   Albumin 3.7 3.5 - 5.0 g/dL   AST 29 15 - 41 U/L   ALT 24 0 - 44 U/L   Alkaline Phosphatase 72 38 - 126 U/L   Total Bilirubin 0.9  0.3 - 1.2 mg/dL   GFR calc non Af Amer 48 (L) >60 mL/min   GFR calc Af Amer 56 (L) >60 mL/min    Comment: (NOTE) The eGFR has been calculated using the CKD EPI equation. This calculation has not been validated in all clinical situations. eGFR's persistently <60 mL/min signify possible Chronic Kidney Disease.    Anion gap 11 5 - 15    Comment: Performed at [facility] [address] [place], [place] [address]  CBC with Differential     Status: Abnormal   Collection Time: 11/24/17 11:43 AM  Result Value Ref Range   WBC 6.5 4.0 - 10.5 K/uL   RBC 4.33 4.22 - 5.81 MIL/uL   Hemoglobin 12.9 (L) 13.0 - 17.0 g/dL   HCT 39.6 39.0 - 52.0 %   MCV 91.5 78.0 - 100.0 fL   MCH 29.8 26.0 - 34.0 pg   MCHC 32.6 30.0 - 36.0 g/dL   RDW 12.6 11.5 - 15.5 %   Platelets 172 150 - 400 K/uL   Neutrophils Relative % 60 %   Neutro Abs 3.9 1.7 - 7.7 K/uL   Lymphocytes Relative 25 %  Lymphs Abs 1.6 0.7 - 4.0 K/uL   Monocytes Relative 14 %   Monocytes Absolute 0.9 0.1 - 1.0 K/uL   Eosinophils Relative 1 %   Eosinophils Absolute 0.1 0.0 - 0.7 K/uL   Basophils Relative 0 %   Basophils Absolute 0.0 0.0 - 0.1 K/uL   Immature Granulocytes 0 %   Abs Immature Granulocytes 0.0 0.0 - 0.1 K/uL    Comment: Performed at [facility], [place] [address] [place], [place] [address]  I-Stat CG4 Lactic Acid, ED     Status: None   Collection Time: 11/24/17 12:14 PM  Result Value Ref Range   Lactic Acid, Venous 0.94 0.5 - 1.9 mmol/L    No results found.  Review of Systems  Constitutional: Negative for chills, fever and weight loss.  HENT: Negative for ear discharge, ear pain, hearing loss and tinnitus.   Eyes: Negative for blurred vision, double vision, photophobia and pain.  Respiratory: Negative for cough, sputum production and shortness of breath.   Cardiovascular: Negative for chest pain.  Gastrointestinal: Negative for abdominal pain, nausea and vomiting.  Genitourinary: Negative for dysuria, flank  pain, frequency and urgency.  Musculoskeletal: Positive for joint pain (Left thumb). Negative for back pain, falls, myalgias and neck pain.  Neurological: Negative for dizziness, tingling, sensory change, focal weakness, loss of consciousness and headaches.  Endo/Heme/Allergies: Does not bruise/bleed easily.  Psychiatric/Behavioral: Negative for depression, memory loss and substance abuse. The patient is not nervous/anxious.    Blood pressure (!) 149/84, pulse 95, temperature 98 F (36.7 C), temperature source Oral, resp. rate 16, height 5' 11.75" (1.822 m), weight 97.1 kg, SpO2 97 %. Physical Exam  Constitutional: He appears well-developed and well-nourished. No distress.  HENT:  Head: Normocephalic and atraumatic.  Eyes: Conjunctivae are normal. Right eye exhibits no discharge. Left eye exhibits no discharge. No scleral icterus.  Neck: Normal range of motion.  Cardiovascular: Normal rate and regular rhythm.  Respiratory: Effort normal. No respiratory distress.  Musculoskeletal:  Left shoulder, elbow, wrist, digits- no skin wounds, thumb fusiform edema with fluctuance dorsally from nail to level of MCP, , no instability, no blocks to motion  Sens  Ax/R/M/U intact  Mot   Ax/ R/ PIN/ M/ AIN/ U intact  Rad 2+  Neurological: He is alert.  Skin: Skin is warm and dry. He is not diaphoretic.  Psychiatric: He has a normal mood and affect. His behavior is normal.    Assessment/Plan: Left thumb infection -- Plan I&D at bedside. F/u with Dr. Caralyn Guile early next week.    [name], PA-C Orthopedic Surgery [phone] 11/24/2017, 2:17 PM

## 2017-11-24 NOTE — ED Triage Notes (Addendum)
Pt. Cut his lt. Thumb pad on a pipe and it healed.  Few days ago started swelling .  Rt. Thumb is swollen, pt. Can move his thumb , +CNS> redness warmth  and swelling are into the lt. Hand, also a large blister noted.   Pt. Reports having a fever on Saturday.  Pt. Denies any n/v/d.  Pt. Is alert and oriented X4.

## 2017-11-24 NOTE — ED Provider Notes (Signed)
[facility] EMERGENCY DEPARTMENT Provider Note   CSN: [id number] Arrival date & time: 11/24/17  1104     History   Chief Complaint Chief Complaint  Patient presents with  . Wound Infection  . Finger Injury    HPI Austin Owens. is a 67 y.o. male presenting for evaluation of finger infection.  Patient states he cut his left thumb accidentally approximately 1 week ago.  The cut appeared to heal, there were no issues with the thumb.  However the possible days, there has been increasing swelling and pain.  He has not been taking anything for pain, besides his chronic medication which includes meloxicam and etodolac.  He denies numbness or tingling.  He denies new trauma or injury.  His tetanus is up-to-date.  He is not immunocompromise, denies history of diabetes.  He denies injury or pain to any other finger.  He ports fever this weekend, but it has resolved and not returned the past several days.  He denies nausea or vomiting.  He denies pain or swelling in his wrist or forearm.  HPI  Past Medical History:  Diagnosis Date  . GERD (gastroesophageal reflux disease)   . History of bleeding ulcers    x3  . Inguinal hernia     There are no active problems to display for this patient.   Past Surgical History:  Procedure Laterality Date  . EYE SURGERY     x7  . GANGLION CYST EXCISION     left hand  . INGUINAL HERNIA REPAIR Right 12/27/2014   Procedure: LAPAROSCOPIC RIGHT INGUINAL HERNIA REPAIR WITH MESH;  Surgeon: Greer Pickerel, MD;  Location: [facility];  Service: General;  Laterality: Right;  . INSERTION OF MESH Right 12/27/2014   Procedure: INSERTION OF MESH;  Surgeon: Greer Pickerel, MD;  Location: [facility];  Service: General;  Laterality: Right;        Home Medications    Prior to Admission medications   Medication Sig Start Date End Date Taking? Authorizing Provider  acetaminophen (TYLENOL) 500 MG tablet Take 500 mg by mouth every 6 (six) hours as needed  for mild pain or headache.    [provider]  cephALEXin (KEFLEX) 500 MG capsule Take 1 capsule (500 mg total) by mouth 4 (four) times daily for 7 days. 11/24/17 12/01/17  Daelyn Pettaway, PA-C  Multiple Vitamin (MULTIVITAMIN WITH MINERALS) TABS tablet Take 1 tablet by mouth daily.    [provider]  naproxen sodium (ANAPROX) 220 MG tablet Take 220 mg by mouth 2 (two) times daily with a meal.    [provider]  omeprazole (PRILOSEC) 20 MG capsule Take 20 mg by mouth daily.    [provider]  oxyCODONE (OXY IR/ROXICODONE) 5 MG immediate release tablet Take 1 tablet (5 mg total) by mouth every 4 (four) hours as needed for moderate pain, severe pain or breakthrough pain. 12/27/14   Greer Pickerel, MD  tamsulosin (FLOMAX) 0.4 MG CAPS capsule TAKE ONE CAPSULE BY MOUTH EVERY DAY, START TAKING 2 WEEKS BEFORE SURGERY 11/14/14   [provider]    Family History No family history on file.  Social History Social History   Tobacco Use  . Smoking status: Former Smoker    Last attempt to quit: 12/19/1969    Years since quitting: 47.9  . Smokeless tobacco: Never Used  Substance Use Topics  . Alcohol use: No    Comment: stopped 1971  . Drug use: No  Allergies   Patient has no known allergies.   Review of Systems Review of Systems  Musculoskeletal: Positive for joint swelling.  Skin: Positive for wound (last week).  Allergic/Immunologic: Negative for immunocompromised state.  Neurological: Negative for numbness.  Hematological: Does not bruise/bleed easily.  All other systems reviewed and are negative.    Physical Exam Updated Vital Signs BP (!) 143/78   Pulse 84   Temp 98 F (36.7 C) (Oral)   Resp 16   Ht 5' 11.75" (1.822 m)   Wt 97.1 kg   SpO2 94%   BMI 29.23 kg/m   Physical Exam  Constitutional: He is oriented to person, place, and time. He appears well-developed and well-nourished. No distress.  Appears in no distress    HENT:  Head: Normocephalic and atraumatic.  Eyes: EOM are normal.  Neck: Normal range of motion.  Cardiovascular: Normal rate, regular rhythm and intact distal pulses.  Pulmonary/Chest: Effort normal and breath sounds normal. No respiratory distress. He has no wheezes.  Abdominal: He exhibits no distension.  Musculoskeletal: Normal range of motion. He exhibits edema and tenderness.  See picture below.  Obvious swelling of the left thumb with dorsal blister/bullae appearance.  Sensation of distal thumb intact.  Strength against resistance intact.  Decreased range of motion due to swelling.  Erythema, warmth, and tenderness of the skin at the base of the swelling.  No streaking.  No extension into the wrist.  Full active range of motion of the wrist.  Soft forearm compartments, no redness or swelling of the forearm.  Neurological: He is alert and oriented to person, place, and time. No sensory deficit.  Skin: Skin is warm. Capillary refill takes less than 2 seconds. No rash noted.  Psychiatric: He has a normal mood and affect.  Nursing note and vitals reviewed.            ED Treatments / Results  Labs (all labs ordered are listed, but only abnormal results are displayed) Labs Reviewed  COMPREHENSIVE METABOLIC PANEL - Abnormal; Notable for the following components:      Result Value   Potassium 3.4 (*)    Glucose, Bld 126 (*)    BUN 30 (*)    Creatinine, Ser 1.43 (*)    GFR calc non Af Amer 48 (*)    GFR calc Af Amer 56 (*)    All other components within normal limits  CBC WITH DIFFERENTIAL/PLATELET - Abnormal; Notable for the following components:   Hemoglobin 12.9 (*)    All other components within normal limits  URINALYSIS, ROUTINE W REFLEX MICROSCOPIC  I-STAT CG4 LACTIC ACID, ED    EKG None  Radiology No results found.  Procedures Procedures (including critical care time)  Medications Ordered in ED Medications  lidocaine (PF) (XYLOCAINE) 1 % injection 10 mL  (10 mLs Other Given 11/24/17 1453)     Initial Impression / Assessment and Plan / ED Course  I have reviewed the triage vital signs and the nursing notes.  Pertinent labs & imaging results that were available during my care of the patient were reviewed by me and considered in my medical decision making (see chart for details).     Patient presenting for evaluation of left thumb swelling and pain.  Physical exam reassuring, patient is afebrile not tachycardic.  Appears nontoxic.  However, exam of the thumb concerning, as it is significantly swollen with erythema and warmth.  Concern for infection.  Labs reassuring, no leukocytosis and lactic is normal.  Patient without signs of systemic infections.  Likely localized infection.  Will consult with hand for further evaluation.  Case discussed with attending, Dr. Ronnald Nian evaluated the patient.  Discussed with Lloyd Huger, who states Dr. Caralyn Guile recommends I&D in the ER and follow-up in office.  I&D performed by M. Dellis Filbert.  Discussed wound care.  Patient discharged on Keflex per hand recommendation.  Patient to follow-up next week.  At this time, patient appears safe for discharge.  Return precautions given.  Patient states he understands and agrees plan.   Final Clinical Impressions(s) / ED Diagnoses   Final diagnoses:  Infection of thumb    ED Discharge Orders         Ordered    cephALEXin (KEFLEX) 500 MG capsule  4 times daily     11/24/17 1519           [place]iana Gamero, PA-C 11/24/17 1624    Lennice Sites, DO 11/24/17 2123

## 2017-11-29 DIAGNOSIS — L03012 Cellulitis of left finger: Secondary | ICD-10-CM | POA: Diagnosis not present

## 2017-11-29 DIAGNOSIS — G5601 Carpal tunnel syndrome, right upper limb: Secondary | ICD-10-CM | POA: Diagnosis not present

## 2017-11-29 DIAGNOSIS — M79644 Pain in right finger(s): Secondary | ICD-10-CM | POA: Diagnosis not present

## 2017-12-01 DIAGNOSIS — M79644 Pain in right finger(s): Secondary | ICD-10-CM | POA: Diagnosis not present

## 2017-12-03 DIAGNOSIS — M79644 Pain in right finger(s): Secondary | ICD-10-CM | POA: Diagnosis not present

## 2017-12-06 DIAGNOSIS — M79644 Pain in right finger(s): Secondary | ICD-10-CM | POA: Diagnosis not present

## 2017-12-08 DIAGNOSIS — M79644 Pain in right finger(s): Secondary | ICD-10-CM | POA: Diagnosis not present

## 2017-12-10 DIAGNOSIS — M79644 Pain in right finger(s): Secondary | ICD-10-CM | POA: Diagnosis not present

## 2017-12-13 DIAGNOSIS — M79644 Pain in right finger(s): Secondary | ICD-10-CM | POA: Diagnosis not present

## 2017-12-15 DIAGNOSIS — M79644 Pain in right finger(s): Secondary | ICD-10-CM | POA: Diagnosis not present

## 2017-12-17 DIAGNOSIS — M79644 Pain in right finger(s): Secondary | ICD-10-CM | POA: Diagnosis not present

## 2017-12-28 DIAGNOSIS — G5601 Carpal tunnel syndrome, right upper limb: Secondary | ICD-10-CM | POA: Diagnosis not present

## 2018-01-28 DIAGNOSIS — I10 Essential (primary) hypertension: Secondary | ICD-10-CM | POA: Diagnosis not present

## 2018-03-28 DIAGNOSIS — H2513 Age-related nuclear cataract, bilateral: Secondary | ICD-10-CM | POA: Diagnosis not present

## 2018-03-28 DIAGNOSIS — H40013 Open angle with borderline findings, low risk, bilateral: Secondary | ICD-10-CM | POA: Diagnosis not present

## 2018-05-30 DIAGNOSIS — M130 Polyarthritis, unspecified: Secondary | ICD-10-CM | POA: Diagnosis not present

## 2018-05-30 DIAGNOSIS — E1169 Type 2 diabetes mellitus with other specified complication: Secondary | ICD-10-CM | POA: Diagnosis not present

## 2018-05-30 DIAGNOSIS — I10 Essential (primary) hypertension: Secondary | ICD-10-CM | POA: Diagnosis not present

## 2018-06-14 DIAGNOSIS — Z0000 Encounter for general adult medical examination without abnormal findings: Secondary | ICD-10-CM | POA: Diagnosis not present

## 2018-10-04 DIAGNOSIS — M130 Polyarthritis, unspecified: Secondary | ICD-10-CM | POA: Diagnosis not present

## 2018-10-04 DIAGNOSIS — E1169 Type 2 diabetes mellitus with other specified complication: Secondary | ICD-10-CM | POA: Diagnosis not present

## 2018-10-04 DIAGNOSIS — I10 Essential (primary) hypertension: Secondary | ICD-10-CM | POA: Diagnosis not present

## 2019-02-06 DIAGNOSIS — R7303 Prediabetes: Secondary | ICD-10-CM | POA: Diagnosis not present

## 2019-02-06 DIAGNOSIS — I10 Essential (primary) hypertension: Secondary | ICD-10-CM | POA: Diagnosis not present

## 2019-02-06 DIAGNOSIS — E1169 Type 2 diabetes mellitus with other specified complication: Secondary | ICD-10-CM | POA: Diagnosis not present

## 2019-02-06 DIAGNOSIS — M130 Polyarthritis, unspecified: Secondary | ICD-10-CM | POA: Diagnosis not present

## 2019-02-06 DIAGNOSIS — E782 Mixed hyperlipidemia: Secondary | ICD-10-CM | POA: Diagnosis not present

## 2019-02-14 DIAGNOSIS — R7303 Prediabetes: Secondary | ICD-10-CM | POA: Diagnosis not present

## 2019-02-14 DIAGNOSIS — E1169 Type 2 diabetes mellitus with other specified complication: Secondary | ICD-10-CM | POA: Diagnosis not present

## 2019-02-14 DIAGNOSIS — E782 Mixed hyperlipidemia: Secondary | ICD-10-CM | POA: Diagnosis not present

## 2019-02-14 DIAGNOSIS — I10 Essential (primary) hypertension: Secondary | ICD-10-CM | POA: Diagnosis not present

## 2019-06-06 DIAGNOSIS — E1169 Type 2 diabetes mellitus with other specified complication: Secondary | ICD-10-CM | POA: Diagnosis not present

## 2019-06-06 DIAGNOSIS — M130 Polyarthritis, unspecified: Secondary | ICD-10-CM | POA: Diagnosis not present

## 2019-06-06 DIAGNOSIS — I10 Essential (primary) hypertension: Secondary | ICD-10-CM | POA: Diagnosis not present

## 2019-10-05 DIAGNOSIS — R7303 Prediabetes: Secondary | ICD-10-CM | POA: Diagnosis not present

## 2019-10-05 DIAGNOSIS — Z0000 Encounter for general adult medical examination without abnormal findings: Secondary | ICD-10-CM | POA: Diagnosis not present

## 2019-10-05 DIAGNOSIS — D649 Anemia, unspecified: Secondary | ICD-10-CM | POA: Diagnosis not present

## 2019-10-05 DIAGNOSIS — E1169 Type 2 diabetes mellitus with other specified complication: Secondary | ICD-10-CM | POA: Diagnosis not present

## 2019-10-05 DIAGNOSIS — E559 Vitamin D deficiency, unspecified: Secondary | ICD-10-CM | POA: Diagnosis not present

## 2019-10-05 DIAGNOSIS — I10 Essential (primary) hypertension: Secondary | ICD-10-CM | POA: Diagnosis not present

## 2019-10-05 DIAGNOSIS — E782 Mixed hyperlipidemia: Secondary | ICD-10-CM | POA: Diagnosis not present

## 2019-12-14 DIAGNOSIS — E1169 Type 2 diabetes mellitus with other specified complication: Secondary | ICD-10-CM | POA: Diagnosis not present

## 2019-12-14 DIAGNOSIS — I10 Essential (primary) hypertension: Secondary | ICD-10-CM | POA: Diagnosis not present

## 2019-12-14 DIAGNOSIS — E7849 Other hyperlipidemia: Secondary | ICD-10-CM | POA: Diagnosis not present

## 2020-01-30 DIAGNOSIS — M130 Polyarthritis, unspecified: Secondary | ICD-10-CM | POA: Diagnosis not present

## 2020-01-30 DIAGNOSIS — E782 Mixed hyperlipidemia: Secondary | ICD-10-CM | POA: Diagnosis not present

## 2020-01-30 DIAGNOSIS — R7303 Prediabetes: Secondary | ICD-10-CM | POA: Diagnosis not present

## 2020-01-30 DIAGNOSIS — I10 Essential (primary) hypertension: Secondary | ICD-10-CM | POA: Diagnosis not present

## 2020-03-04 DIAGNOSIS — E785 Hyperlipidemia, unspecified: Secondary | ICD-10-CM | POA: Diagnosis not present

## 2020-03-04 DIAGNOSIS — I10 Essential (primary) hypertension: Secondary | ICD-10-CM | POA: Diagnosis not present

## 2020-03-15 DIAGNOSIS — M130 Polyarthritis, unspecified: Secondary | ICD-10-CM | POA: Diagnosis not present

## 2020-03-15 DIAGNOSIS — I10 Essential (primary) hypertension: Secondary | ICD-10-CM | POA: Diagnosis not present

## 2020-05-27 DIAGNOSIS — I10 Essential (primary) hypertension: Secondary | ICD-10-CM | POA: Diagnosis not present

## 2020-05-27 DIAGNOSIS — Z0001 Encounter for general adult medical examination with abnormal findings: Secondary | ICD-10-CM | POA: Diagnosis not present

## 2020-05-27 DIAGNOSIS — R7309 Other abnormal glucose: Secondary | ICD-10-CM | POA: Diagnosis not present

## 2020-05-27 DIAGNOSIS — N189 Chronic kidney disease, unspecified: Secondary | ICD-10-CM | POA: Diagnosis not present

## 2020-06-13 DIAGNOSIS — E1122 Type 2 diabetes mellitus with diabetic chronic kidney disease: Secondary | ICD-10-CM | POA: Diagnosis not present

## 2020-06-13 DIAGNOSIS — I129 Hypertensive chronic kidney disease with stage 1 through stage 4 chronic kidney disease, or unspecified chronic kidney disease: Secondary | ICD-10-CM | POA: Diagnosis not present

## 2020-06-13 DIAGNOSIS — N189 Chronic kidney disease, unspecified: Secondary | ICD-10-CM | POA: Diagnosis not present

## 2020-08-06 DIAGNOSIS — I10 Essential (primary) hypertension: Secondary | ICD-10-CM | POA: Diagnosis not present

## 2020-08-06 DIAGNOSIS — I110 Hypertensive heart disease with heart failure: Secondary | ICD-10-CM | POA: Diagnosis not present

## 2020-08-06 DIAGNOSIS — I129 Hypertensive chronic kidney disease with stage 1 through stage 4 chronic kidney disease, or unspecified chronic kidney disease: Secondary | ICD-10-CM | POA: Diagnosis not present

## 2020-08-06 DIAGNOSIS — E7800 Pure hypercholesterolemia, unspecified: Secondary | ICD-10-CM | POA: Diagnosis not present

## 2020-08-06 DIAGNOSIS — E1169 Type 2 diabetes mellitus with other specified complication: Secondary | ICD-10-CM | POA: Diagnosis not present

## 2020-08-06 DIAGNOSIS — T7840XA Allergy, unspecified, initial encounter: Secondary | ICD-10-CM | POA: Diagnosis not present

## 2020-08-13 DIAGNOSIS — N189 Chronic kidney disease, unspecified: Secondary | ICD-10-CM | POA: Diagnosis not present

## 2020-08-13 DIAGNOSIS — E1122 Type 2 diabetes mellitus with diabetic chronic kidney disease: Secondary | ICD-10-CM | POA: Diagnosis not present

## 2020-08-13 DIAGNOSIS — I129 Hypertensive chronic kidney disease with stage 1 through stage 4 chronic kidney disease, or unspecified chronic kidney disease: Secondary | ICD-10-CM | POA: Diagnosis not present

## 2020-08-22 DIAGNOSIS — Z883 Allergy status to other anti-infective agents status: Secondary | ICD-10-CM | POA: Diagnosis not present

## 2020-08-22 DIAGNOSIS — E785 Hyperlipidemia, unspecified: Secondary | ICD-10-CM | POA: Diagnosis not present

## 2020-08-22 DIAGNOSIS — E782 Mixed hyperlipidemia: Secondary | ICD-10-CM | POA: Diagnosis not present

## 2020-08-22 DIAGNOSIS — I10 Essential (primary) hypertension: Secondary | ICD-10-CM | POA: Diagnosis not present

## 2020-08-22 DIAGNOSIS — D649 Anemia, unspecified: Secondary | ICD-10-CM | POA: Diagnosis not present

## 2020-08-22 DIAGNOSIS — R7303 Prediabetes: Secondary | ICD-10-CM | POA: Diagnosis not present

## 2020-08-22 DIAGNOSIS — D72819 Decreased white blood cell count, unspecified: Secondary | ICD-10-CM | POA: Diagnosis not present

## 2020-09-12 DIAGNOSIS — I129 Hypertensive chronic kidney disease with stage 1 through stage 4 chronic kidney disease, or unspecified chronic kidney disease: Secondary | ICD-10-CM | POA: Diagnosis not present

## 2020-09-12 DIAGNOSIS — E1122 Type 2 diabetes mellitus with diabetic chronic kidney disease: Secondary | ICD-10-CM | POA: Diagnosis not present

## 2020-09-12 DIAGNOSIS — N189 Chronic kidney disease, unspecified: Secondary | ICD-10-CM | POA: Diagnosis not present

## 2020-09-23 DIAGNOSIS — E782 Mixed hyperlipidemia: Secondary | ICD-10-CM | POA: Diagnosis not present

## 2020-09-23 DIAGNOSIS — N183 Chronic kidney disease, stage 3 unspecified: Secondary | ICD-10-CM | POA: Diagnosis not present

## 2020-09-23 DIAGNOSIS — E7849 Other hyperlipidemia: Secondary | ICD-10-CM | POA: Diagnosis not present

## 2020-09-23 DIAGNOSIS — R7303 Prediabetes: Secondary | ICD-10-CM | POA: Diagnosis not present

## 2020-09-23 DIAGNOSIS — D649 Anemia, unspecified: Secondary | ICD-10-CM | POA: Diagnosis not present

## 2020-09-23 DIAGNOSIS — I129 Hypertensive chronic kidney disease with stage 1 through stage 4 chronic kidney disease, or unspecified chronic kidney disease: Secondary | ICD-10-CM | POA: Diagnosis not present

## 2020-09-23 DIAGNOSIS — D559 Anemia due to enzyme disorder, unspecified: Secondary | ICD-10-CM | POA: Diagnosis not present

## 2020-10-10 ENCOUNTER — Encounter: Payer: Self-pay | Admitting: Hematology and Oncology

## 2020-10-10 ENCOUNTER — Inpatient Hospital Stay: Payer: Medicare Other | Attending: Hematology and Oncology | Admitting: Hematology and Oncology

## 2020-10-10 ENCOUNTER — Other Ambulatory Visit: Payer: Self-pay

## 2020-10-10 ENCOUNTER — Inpatient Hospital Stay: Payer: Medicare Other

## 2020-10-10 VITALS — BP 127/80 | HR 81 | Temp 98.7°F | Resp 18 | Ht 71.75 in | Wt 185.4 lb

## 2020-10-10 DIAGNOSIS — K219 Gastro-esophageal reflux disease without esophagitis: Secondary | ICD-10-CM | POA: Diagnosis not present

## 2020-10-10 DIAGNOSIS — D649 Anemia, unspecified: Secondary | ICD-10-CM

## 2020-10-10 DIAGNOSIS — D61818 Other pancytopenia: Secondary | ICD-10-CM | POA: Diagnosis not present

## 2020-10-10 DIAGNOSIS — D709 Neutropenia, unspecified: Secondary | ICD-10-CM

## 2020-10-10 DIAGNOSIS — M1990 Unspecified osteoarthritis, unspecified site: Secondary | ICD-10-CM | POA: Diagnosis not present

## 2020-10-10 LAB — TSH: TSH: 1.233 u[IU]/mL (ref 0.320–4.118)

## 2020-10-10 LAB — CBC WITH DIFFERENTIAL/PLATELET
Abs Immature Granulocytes: 0 10*3/uL (ref 0.00–0.07)
Basophils Absolute: 0 10*3/uL (ref 0.0–0.1)
Basophils Relative: 0 %
Eosinophils Absolute: 0 10*3/uL (ref 0.0–0.5)
Eosinophils Relative: 2 %
HCT: 35.8 % — ABNORMAL LOW (ref 39.0–52.0)
Hemoglobin: 12.5 g/dL — ABNORMAL LOW (ref 13.0–17.0)
Immature Granulocytes: 0 %
Lymphocytes Relative: 58 %
Lymphs Abs: 1.4 10*3/uL (ref 0.7–4.0)
MCH: 30.1 pg (ref 26.0–34.0)
MCHC: 34.9 g/dL (ref 30.0–36.0)
MCV: 86.3 fL (ref 80.0–100.0)
Monocytes Absolute: 0.4 10*3/uL (ref 0.1–1.0)
Monocytes Relative: 15 %
Neutro Abs: 0.6 10*3/uL — ABNORMAL LOW (ref 1.7–7.7)
Neutrophils Relative %: 25 %
Platelets: 115 10*3/uL — ABNORMAL LOW (ref 150–400)
RBC: 4.15 MIL/uL — ABNORMAL LOW (ref 4.22–5.81)
RDW: 12.6 % (ref 11.5–15.5)
WBC: 2.4 10*3/uL — ABNORMAL LOW (ref 4.0–10.5)
nRBC: 0 % (ref 0.0–0.2)

## 2020-10-10 LAB — COMPREHENSIVE METABOLIC PANEL
ALT: 20 U/L (ref 0–44)
AST: 30 U/L (ref 15–41)
Albumin: 4 g/dL (ref 3.5–5.0)
Alkaline Phosphatase: 89 U/L (ref 38–126)
Anion gap: 8 (ref 5–15)
BUN: 28 mg/dL — ABNORMAL HIGH (ref 8–23)
CO2: 27 mmol/L (ref 22–32)
Calcium: 10 mg/dL (ref 8.9–10.3)
Chloride: 106 mmol/L (ref 98–111)
Creatinine, Ser: 1.48 mg/dL — ABNORMAL HIGH (ref 0.61–1.24)
GFR, Estimated: 50 mL/min — ABNORMAL LOW (ref >60)
Glucose, Bld: 93 mg/dL (ref 70–99)
Potassium: 4.2 mmol/L (ref 3.5–5.1)
Sodium: 141 mmol/L (ref 135–145)
Total Bilirubin: 0.5 mg/dL (ref 0.3–1.2)
Total Protein: 7.5 g/dL (ref 6.5–8.1)

## 2020-10-10 LAB — LACTATE DEHYDROGENASE: LDH: 222 U/L — ABNORMAL HIGH (ref 98–192)

## 2020-10-10 LAB — HEPATITIS PANEL, ACUTE
HCV Ab: NONREACTIVE
Hep A IgM: NONREACTIVE
Hep B C IgM: NONREACTIVE
Hepatitis B Surface Ag: NONREACTIVE

## 2020-10-10 LAB — RETICULOCYTES
Immature Retic Fract: 6.6 % (ref 2.3–15.9)
RBC.: 4.15 MIL/uL — ABNORMAL LOW (ref 4.22–5.81)
Retic Count, Absolute: 32.8 10*3/uL (ref 19.0–186.0)
Retic Ct Pct: 0.8 % (ref 0.4–3.1)

## 2020-10-10 NOTE — Progress Notes (Signed)
[facility] CONSULT NOTE  Patient Care Team: Lucianne Lei, MD as PCP - General (Family Medicine)  CHIEF COMPLAINTS/PURPOSE OF CONSULTATION:  Pancytopenia, initial consultation  ASSESSMENT & PLAN:   This is a very pleasant 70 year old male patient with past medical history significant for arthritis, inguinal hernia, history of bleeding ulcers referred to hematology for worsening pancytopenia.  In the past he was found to have intermittent leukopenia and mild anemia with hemoglobin ranging from 12 to 13 g/dL. Physical examination today is unremarkable, no palpable lymphadenopathy or hepatosplenomegaly. I reviewed labs which showed no evidence of nutritional deficiency, mild anemia, leukopenia which is neutropenia and mild thrombocytopenia.  CBC from today confirmed presence of pancytopenia. He denies any new health symptoms and appears very healthy on physical exam. Today, we recommended proceeding with hepatitis panel, hemolysis labs, thyroid profile as well as ANA. He will return to clinic to follow-up on labs and to discuss additional recommendations.  If his repeat labs from next follow-up continues to show worsening pancytopenia, I might proceed with bone marrow aspiration and biopsy.  If it is overall stable, we might consider surveillance.  It is possible he has early myelodysplasia if there is no other explanation for his pancytopenia.  We do not have an accurate medication list from him today, he cannot remember what he takes.  Anyway he denies any new medications. Return to clinic to discuss lab results and additional recommendations.   HISTORY OF PRESENTING ILLNESS:  Austin Owens. 70 y.o. male is here because of anemia and leukopenia.  This is a pleasant 70 year old male patient who goes by brother Sievert referred to hematology for evaluation of abnormal labs.  Patient tells me that he was told his doctor was worried about the low white blood cell count and anemia.   He feels quite healthy, exercises or bikes regularly, denies any B symptoms.  He attributes subtle changes in his health to his age.  No change in bowel habits or urinary habits.  He denies any blood in stool or black-colored loose stool.  No recent infections or hospitalizations.  No change in medications although he is not quite familiar with the medications he is taking.  Overall he is not a great historian, jumps from topic to topic and I wonder if he has difficulty remembering things. He has some arthritis, denies taking any prednisone ever although this is on his medication list. No over-the-counter or herbal supplements except for multivitamin.  He was taking iron in the past but this was discontinued. No known hepatitis or autoimmune diseases. Rest of the pertinent 10 point ROS reviewed and negative.  MEDICAL HISTORY:  Past Medical History:  Diagnosis Date   GERD (gastroesophageal reflux disease)    History of bleeding ulcers    x3   Inguinal hernia     SURGICAL HISTORY: Past Surgical History:  Procedure Laterality Date   EYE SURGERY     x7   GANGLION CYST EXCISION     left hand   INGUINAL HERNIA REPAIR Right 12/27/2014   Procedure: LAPAROSCOPIC RIGHT INGUINAL HERNIA REPAIR WITH MESH;  Surgeon: Greer Pickerel, MD;  Location: [facility];  Service: General;  Laterality: Right;   INSERTION OF MESH Right 12/27/2014   Procedure: INSERTION OF MESH;  Surgeon: Greer Pickerel, MD;  Location: [facility];  Service: General;  Laterality: Right;    SOCIAL HISTORY: Social History   Socioeconomic History   Marital status: Legally Separated    Spouse name: Not on file  Number of children: Not on file   Years of education: Not on file   Highest education level: Not on file  Occupational History   Not on file  Tobacco Use   Smoking status: Former    Types: Cigarettes    Quit date: 12/19/1969    Years since quitting: 50.8   Smokeless tobacco: Never  Substance and Sexual Activity   Alcohol use:  No    Comment: stopped 1971   Drug use: No   Sexual activity: Not on file  Other Topics Concern   Not on file  Social History Narrative   Not on file   Social Determinants of Health   Financial Resource Strain: Not on file  Food Insecurity: Not on file  Transportation Needs: Not on file  Physical Activity: Not on file  Stress: Not on file  Social Connections: Not on file  Intimate Partner Violence: Not on file    FAMILY HISTORY: No family history on file.  ALLERGIES:  has No Known Allergies.  MEDICATIONS:  Current Outpatient Medications  Medication Sig Dispense Refill   Multiple Vitamin (MULTIVITAMIN WITH MINERALS) TABS tablet Take 1 tablet by mouth daily.     naproxen sodium (ANAPROX) 220 MG tablet Take 220 mg by mouth 2 (two) times daily with a meal.     omeprazole (PRILOSEC) 20 MG capsule Take 20 mg by mouth daily.     predniSONE (DELTASONE) 20 MG tablet Take 20 mg by mouth every morning.     rosuvastatin (CRESTOR) 5 MG tablet Take 5 mg by mouth daily.     tamsulosin (FLOMAX) 0.4 MG CAPS capsule TAKE ONE CAPSULE BY MOUTH EVERY DAY, START TAKING 2 WEEKS BEFORE SURGERY  0   valsartan-hydrochlorothiazide (DIOVAN-HCT) 160-12.5 MG tablet Take 1 tablet by mouth daily.     No current facility-administered medications for this visit.   PHYSICAL EXAMINATION: ECOG PERFORMANCE STATUS: 0 - Asymptomatic  Vitals:   10/10/20 1045  BP: 127/80  Pulse: 81  Resp: 18  Temp: 98.7 F (37.1 C)  SpO2: 100%   Filed Weights   10/10/20 1045  Weight: 185 lb 6.4 oz (84.1 kg)    GENERAL:alert, no distress and comfortable SKIN: skin color, texture, turgor are normal, no rashes or significant lesions EYES: normal, conjunctiva are pink and non-injected, sclera clear OROPHARYNX:no exudate, no erythema and lips, buccal mucosa, and tongue normal  NECK: supple, thyroid normal size, non-tender, without nodularity LYMPH:  no palpable lymphadenopathy in the cervical, axillary LUNGS: clear to  auscultation and percussion with normal breathing effort HEART: regular rate & rhythm and no murmurs and no lower extremity edema ABDOMEN:abdomen soft, non-tender and normal bowel sounds Musculoskeletal:no cyanosis of digits and no clubbing  PSYCH: alert & oriented x 3 with fluent speech NEURO: no focal motor/sensory deficits  LABORATORY DATA:  I have reviewed the data as listed Lab Results  Component Value Date   WBC 6.5 11/24/2017   HGB 12.9 (L) 11/24/2017   HCT 39.6 11/24/2017   MCV 91.5 11/24/2017   PLT 172 11/24/2017     Chemistry      Component Value Date/Time   NA 139 11/24/2017 1143   K 3.4 (L) 11/24/2017 1143   CL 104 11/24/2017 1143   CO2 24 11/24/2017 1143   BUN 30 (H) 11/24/2017 1143   CREATININE 1.43 (H) 11/24/2017 1143      Component Value Date/Time   CALCIUM 9.4 11/24/2017 1143   ALKPHOS 72 11/24/2017 1143   AST 29 11/24/2017 1143  ALT 24 11/24/2017 1143   BILITOT 0.9 11/24/2017 1143     I have reviewed available labs sent to [place].  He was found to have some leukopenia with neutropenia, mild anemia and mild thrombocytopenia.  Labs from 2016 also demonstrated mild leukopenia.  Labs from 2019 also demonstrated mild anemia which has essentially remained stable throughout the years. CBC from today showed white blood cell count of 2400, hemoglobin of 12.5 and platelet count of 115,000, neutropenia with ANC of 600.  Reticulocyte count of 0.8%.   Recent labs did not show any evidence of iron deficiency, 123456 or folic acid deficiency.  RADIOGRAPHIC STUDIES:  I have personally reviewed the radiological images as listed and agreed with the findings in the report. No results found.  All questions were answered. The patient knows to call the clinic with any problems, questions or concerns. I spent 45 minutes in the care of this patient including H and P, review of records, counseling and coordination of care.     Benay Pike, MD 10/10/2020 11:18 AM

## 2020-10-11 LAB — PATHOLOGIST SMEAR REVIEW

## 2020-10-13 DIAGNOSIS — E1122 Type 2 diabetes mellitus with diabetic chronic kidney disease: Secondary | ICD-10-CM | POA: Diagnosis not present

## 2020-10-13 DIAGNOSIS — N189 Chronic kidney disease, unspecified: Secondary | ICD-10-CM | POA: Diagnosis not present

## 2020-10-13 DIAGNOSIS — I129 Hypertensive chronic kidney disease with stage 1 through stage 4 chronic kidney disease, or unspecified chronic kidney disease: Secondary | ICD-10-CM | POA: Diagnosis not present

## 2020-10-16 LAB — ANTINUCLEAR ANTIBODIES, IFA: ANA Ab, IFA: NEGATIVE

## 2020-10-25 DIAGNOSIS — D649 Anemia, unspecified: Secondary | ICD-10-CM | POA: Diagnosis not present

## 2020-10-25 DIAGNOSIS — I10 Essential (primary) hypertension: Secondary | ICD-10-CM | POA: Diagnosis not present

## 2020-11-03 DIAGNOSIS — Z1211 Encounter for screening for malignant neoplasm of colon: Secondary | ICD-10-CM | POA: Diagnosis not present

## 2020-11-03 DIAGNOSIS — Z1212 Encounter for screening for malignant neoplasm of rectum: Secondary | ICD-10-CM | POA: Diagnosis not present

## 2020-11-07 ENCOUNTER — Other Ambulatory Visit: Payer: Self-pay

## 2020-11-07 ENCOUNTER — Encounter: Payer: Self-pay | Admitting: Hematology and Oncology

## 2020-11-07 ENCOUNTER — Inpatient Hospital Stay: Payer: Medicare Other | Attending: Hematology and Oncology | Admitting: Hematology and Oncology

## 2020-11-07 VITALS — BP 134/91 | HR 79 | Temp 98.6°F | Resp 18 | Wt 188.4 lb

## 2020-11-07 DIAGNOSIS — D709 Neutropenia, unspecified: Secondary | ICD-10-CM | POA: Diagnosis not present

## 2020-11-07 DIAGNOSIS — D649 Anemia, unspecified: Secondary | ICD-10-CM | POA: Diagnosis not present

## 2020-11-07 DIAGNOSIS — D61818 Other pancytopenia: Secondary | ICD-10-CM | POA: Insufficient documentation

## 2020-11-07 NOTE — Progress Notes (Signed)
[facility] CONSULT NOTE  Patient Care Team: Lucianne Lei, MD as PCP - General (Family Medicine)  CHIEF COMPLAINTS/PURPOSE OF CONSULTATION:  Pancytopenia, initial consultation  ASSESSMENT & PLAN:   This is a very pleasant 70 year old male patient with past medical history significant for arthritis, inguinal hernia, history of bleeding ulcers referred to hematology for worsening pancytopenia.   In the past he was found to have intermittent leukopenia and mild anemia with hemoglobin ranging from 12 to 13 g/dL. I reviewed labs which showed no evidence of nutritional deficiency, mild anemia, leukopenia which is neutropenia and mild thrombocytopenia.   No clear evidence of autoimmune diseases, hypothyroidism or hemolysis, hepatitis. At this time, given moderate to severe neutropenia and thrombocytopenia, I will recommend a BMB He is agreeable to proceed with BMB, will order CT guided BMB. He will RTC in 4/5 weeks to review BMB results.   HISTORY OF PRESENTING ILLNESS:  Austin Owens. 70 y.o. male is here because of anemia and leukopenia.  This is a pleasant 70 year old male patient who goes by Austin Owens referred to hematology for evaluation of abnormal labs.    Interval History  He was basically referred for neutropenia. He is doing well today. Again he speaks in riddles and jumps from topic to topic with irrelevant conversation.  He denies any new health complaints.  No interim infections or hospitalizations.  No new medications. Rest of the pertinent 10 point ROS reviewed and negative.  MEDICAL HISTORY:  Past Medical History:  Diagnosis Date   GERD (gastroesophageal reflux disease)    History of bleeding ulcers    x3   Inguinal hernia     SURGICAL HISTORY: Past Surgical History:  Procedure Laterality Date   EYE SURGERY     x7   GANGLION CYST EXCISION     left hand   INGUINAL HERNIA REPAIR Right 12/27/2014   Procedure: LAPAROSCOPIC RIGHT INGUINAL  HERNIA REPAIR WITH MESH;  Surgeon: Greer Pickerel, MD;  Location: [facility];  Service: General;  Laterality: Right;   INSERTION OF MESH Right 12/27/2014   Procedure: INSERTION OF MESH;  Surgeon: Greer Pickerel, MD;  Location: [facility];  Service: General;  Laterality: Right;    SOCIAL HISTORY: Social History   Socioeconomic History   Marital status: Legally Separated    Spouse name: Not on file   Number of children: Not on file   Years of education: Not on file   Highest education level: Not on file  Occupational History   Not on file  Tobacco Use   Smoking status: Former    Types: Cigarettes    Quit date: 12/19/1969    Years since quitting: 50.9   Smokeless tobacco: Never  Substance and Sexual Activity   Alcohol use: No    Comment: stopped 1971   Drug use: No   Sexual activity: Not on file  Other Topics Concern   Not on file  Social History Narrative   Not on file   Social Determinants of Health   Financial Resource Strain: Not on file  Food Insecurity: Not on file  Transportation Needs: Not on file  Physical Activity: Not on file  Stress: Not on file  Social Connections: Not on file  Intimate Partner Violence: Not on file    FAMILY HISTORY: No family history on file.  ALLERGIES:  has No Known Allergies.  MEDICATIONS:  Current Outpatient Medications  Medication Sig Dispense Refill   Multiple Vitamin (MULTIVITAMIN WITH MINERALS) TABS tablet Take 1  tablet by mouth daily.     naproxen sodium (ANAPROX) 220 MG tablet Take 220 mg by mouth 2 (two) times daily with a meal.     omeprazole (PRILOSEC) 20 MG capsule Take 20 mg by mouth daily.     predniSONE (DELTASONE) 20 MG tablet Take 20 mg by mouth every morning.     rosuvastatin (CRESTOR) 5 MG tablet Take 5 mg by mouth daily.     tamsulosin (FLOMAX) 0.4 MG CAPS capsule TAKE ONE CAPSULE BY MOUTH EVERY DAY, START TAKING 2 WEEKS BEFORE SURGERY  0   valsartan-hydrochlorothiazide (DIOVAN-HCT) 160-12.5 MG tablet Take 1 tablet by mouth  daily.     No current facility-administered medications for this visit.   PHYSICAL EXAMINATION: ECOG PERFORMANCE STATUS: 0 - Asymptomatic  Vitals:   11/07/20 0818  BP: (!) 134/91  Pulse: 79  Resp: 18  Temp: 98.6 F (37 C)  SpO2: 95%   Filed Weights   11/07/20 0818  Weight: 188 lb 6.4 oz (85.5 kg)    Physical examination deferred in lieu of counseling.  LABORATORY DATA:  I have reviewed the data as listed Lab Results  Component Value Date   WBC 2.4 (L) 10/10/2020   HGB 12.5 (L) 10/10/2020   HCT 35.8 (L) 10/10/2020   MCV 86.3 10/10/2020   PLT 115 (L) 10/10/2020     Chemistry      Component Value Date/Time   NA 141 10/10/2020 1152   K 4.2 10/10/2020 1152   CL 106 10/10/2020 1152   CO2 27 10/10/2020 1152   BUN 28 (H) 10/10/2020 1152   CREATININE 1.48 (H) 10/10/2020 1152      Component Value Date/Time   CALCIUM 10.0 10/10/2020 1152   ALKPHOS 89 10/10/2020 1152   AST 30 10/10/2020 1152   ALT 20 10/10/2020 1152   BILITOT 0.5 10/10/2020 1152     I have reviewed available labs sent to [place].  He was found to have some leukopenia with neutropenia, mild anemia and mild thrombocytopenia.  Labs from 2016 also demonstrated mild leukopenia.  Labs from 2019 demonstrated mild anemia which has essentially remained stable throughout the years. CBC from today showed white blood cell count of 2400, hemoglobin of 12.5 and platelet count of 115,000, neutropenia with ANC of 600.  Reticulocyte count of 0.8%.   Recent labs did not show any evidence of iron deficiency, 123456 or folic acid deficiency. No autoimmune diseases.  No evidence of hypothyroidism, hemolysis.  Hepatitis panel is nonreactive.  RADIOGRAPHIC STUDIES:  I have personally reviewed the radiological images as listed and agreed with the findings in the report. No results found.  All questions were answered. The patient knows to call the clinic with any problems, questions or concerns. I spent 15 minutes in the care of  this patient including history, review of records, counseling and coordination of care.     Benay Pike, MD 11/07/2020 8:45 AM

## 2020-11-07 NOTE — Progress Notes (Deleted)
[facility] NOTE  Patient Care Team: Lucianne Lei, MD as PCP - General (Family Medicine)  CHIEF COMPLAINTS/PURPOSE OF CONSULTATION:   Pancytopenia, follow up.  ASSESSMENT & PLAN:   This is a very pleasant 70 year old male patient with past medical history significant for arthritis, inguinal hernia, history of bleeding ulcers referred to hematology for worsening pancytopenia.  In the past he was found to have intermittent leukopenia and mild anemia with hemoglobin ranging from 12 to 13 g/dL. Physical examination today is unremarkable, no palpable lymphadenopathy or hepatosplenomegaly. I reviewed labs which showed no evidence of nutritional deficiency, mild anemia, leukopenia which is neutropenia and mild thrombocytopenia.  CBC from today confirmed presence of pancytopenia. He denies any new health symptoms and appears very healthy on physical exam. Today, we recommended proceeding with hepatitis panel, hemolysis labs, thyroid profile as well as ANA. He will return to clinic to follow-up on labs and to discuss additional recommendations.  If his repeat labs from next follow-up continues to show worsening pancytopenia, I might proceed with bone marrow aspiration and biopsy.  If it is overall stable, we might consider surveillance.  It is possible he has early myelodysplasia if there is no other explanation for his pancytopenia.  We do not have an accurate medication list from him today, he cannot remember what he takes.  Anyway he denies any new medications. Return to clinic to discuss lab results and additional recommendations.   HISTORY OF PRESENTING ILLNESS:   [name]. 70 y.o. male is here because of anemia and leukopenia.  This is a pleasant 69 year old male patient who goes by brother Ruehl referred to hematology for evaluation of abnormal labs.    No known hepatitis or autoimmune diseases. Rest of the pertinent 10 point ROS reviewed and  negative.  MEDICAL HISTORY:  Past Medical History:  Diagnosis Date   GERD (gastroesophageal reflux disease)    History of bleeding ulcers    x3   Inguinal hernia     SURGICAL HISTORY: Past Surgical History:  Procedure Laterality Date   EYE SURGERY     x7   GANGLION CYST EXCISION     left hand   INGUINAL HERNIA REPAIR Right 12/27/2014   Procedure: LAPAROSCOPIC RIGHT INGUINAL HERNIA REPAIR WITH MESH;  Surgeon: Greer Pickerel, MD;  Location: [facility];  Service: General;  Laterality: Right;   INSERTION OF MESH Right 12/27/2014   Procedure: INSERTION OF MESH;  Surgeon: Greer Pickerel, MD;  Location: [facility];  Service: General;  Laterality: Right;    SOCIAL HISTORY: Social History   Socioeconomic History   Marital status: Legally Separated    Spouse name: Not on file   Number of children: Not on file   Years of education: Not on file   Highest education level: Not on file  Occupational History   Not on file  Tobacco Use   Smoking status: Former    Types: Cigarettes    Quit date: 12/19/1969    Years since quitting: 50.9   Smokeless tobacco: Never  Substance and Sexual Activity   Alcohol use: No    Comment: stopped 1971   Drug use: No   Sexual activity: Not on file  Other Topics Concern   Not on file  Social History Narrative   Not on file   Social Determinants of Health   Financial Resource Strain: Not on file  Food Insecurity: Not on file  Transportation Needs: Not on file  Physical Activity: Not on file  Stress: Not on file  Social Connections: Not on file  Intimate Partner Violence: Not on file    FAMILY HISTORY: No family history on file.  ALLERGIES:  has No Known Allergies.  MEDICATIONS:  Current Outpatient Medications  Medication Sig Dispense Refill   Multiple Vitamin (MULTIVITAMIN WITH MINERALS) TABS tablet Take 1 tablet by mouth daily.     naproxen sodium (ANAPROX) 220 MG tablet Take 220 mg by mouth 2 (two) times daily with a meal.     omeprazole  (PRILOSEC) 20 MG capsule Take 20 mg by mouth daily.     predniSONE (DELTASONE) 20 MG tablet Take 20 mg by mouth every morning.     rosuvastatin (CRESTOR) 5 MG tablet Take 5 mg by mouth daily.     tamsulosin (FLOMAX) 0.4 MG CAPS capsule TAKE ONE CAPSULE BY MOUTH EVERY DAY, START TAKING 2 WEEKS BEFORE SURGERY  0   valsartan-hydrochlorothiazide (DIOVAN-HCT) 160-12.5 MG tablet Take 1 tablet by mouth daily.     No current facility-administered medications for this visit.   PHYSICAL EXAMINATION: ECOG PERFORMANCE STATUS: 0 - Asymptomatic  Vitals:   11/07/20 0818  BP: (!) 134/91  Pulse: 79  Resp: 18  Temp: 98.6 F (37 C)  SpO2: 95%   Filed Weights   11/07/20 0818  Weight: 188 lb 6.4 oz (85.5 kg)    GENERAL:alert, no distress and comfortable SKIN: skin color, texture, turgor are normal, no rashes or significant lesions EYES: normal, conjunctiva are pink and non-injected, sclera clear OROPHARYNX:no exudate, no erythema and lips, buccal mucosa, and tongue normal  NECK: supple, thyroid normal size, non-tender, without nodularity LYMPH:  no palpable lymphadenopathy in the cervical, axillary LUNGS: clear to auscultation and percussion with normal breathing effort HEART: regular rate & rhythm and no murmurs and no lower extremity edema ABDOMEN:abdomen soft, non-tender and normal bowel sounds Musculoskeletal:no cyanosis of digits and no clubbing  PSYCH: alert & oriented x 3 with fluent speech NEURO: no focal motor/sensory deficits  LABORATORY DATA:  I have reviewed the data as listed Lab Results  Component Value Date   WBC 2.4 (L) 10/10/2020   HGB 12.5 (L) 10/10/2020   HCT 35.8 (L) 10/10/2020   MCV 86.3 10/10/2020   PLT 115 (L) 10/10/2020     Chemistry      Component Value Date/Time   NA 141 10/10/2020 1152   K 4.2 10/10/2020 1152   CL 106 10/10/2020 1152   CO2 27 10/10/2020 1152   BUN 28 (H) 10/10/2020 1152   CREATININE 1.48 (H) 10/10/2020 1152      Component Value  Date/Time   CALCIUM 10.0 10/10/2020 1152   ALKPHOS 89 10/10/2020 1152   AST 30 10/10/2020 1152   ALT 20 10/10/2020 1152   BILITOT 0.5 10/10/2020 1152     I have reviewed available labs sent to [place].  He was found to have some leukopenia with neutropenia, mild anemia and mild thrombocytopenia.  Labs from 2016 also demonstrated mild leukopenia.  Labs from 2019 also demonstrated mild anemia which has essentially remained stable throughout the years. CBC from today showed white blood cell count of 2400, hemoglobin of 12.5 and platelet count of 115,000, neutropenia with ANC of 600.  Reticulocyte count of 0.8%.   Recent labs did not show any evidence of iron deficiency, 123456 or folic acid deficiency.  RADIOGRAPHIC STUDIES:  I have personally reviewed the radiological images as listed and agreed with the findings in the report. No results found.  All questions were answered.  The patient knows to call the clinic with any problems, questions or concerns. I spent 45 minutes in the care of this patient including H and P, review of records, counseling and coordination of care.     Benay Pike, MD 11/07/2020 8:47 AM

## 2020-11-07 NOTE — Addendum Note (Signed)
Addended by: Adaline Sill on: 11/07/2020 12:18 PM   Modules accepted: Orders

## 2020-11-20 ENCOUNTER — Other Ambulatory Visit: Payer: Self-pay | Admitting: Student

## 2020-11-20 NOTE — H&P (Signed)
Chief Complaint: Patient was seen in consultation today for pancytopenia  Referring Physician(s): Iruku,Praveena  Supervising Physician: Aletta Edouard  Patient Status: [facility] - Out-pt  History of Present Illness: Austin Owens. is a 70 y.o. male with past medical history of GERD, recurrent bleeding ulcers, who recently presented to with pancytopenia.  He was evaluated by Heme/Onc who has referred to [facility] for bone marrow biopsy.   [name] presents today in his usual state of health.  He has been NPO.  He does not take blood thinners. He is agreeable to the procedure today.   Past Medical History:  Diagnosis Date   GERD (gastroesophageal reflux disease)    History of bleeding ulcers    x3   Inguinal hernia     Past Surgical History:  Procedure Laterality Date   EYE SURGERY     x7   GANGLION CYST EXCISION     left hand   INGUINAL HERNIA REPAIR Right 12/27/2014   Procedure: LAPAROSCOPIC RIGHT INGUINAL HERNIA REPAIR WITH MESH;  Surgeon: Greer Pickerel, MD;  Location: [facility];  Service: General;  Laterality: Right;   INSERTION OF MESH Right 12/27/2014   Procedure: INSERTION OF MESH;  Surgeon: Greer Pickerel, MD;  Location: [facility];  Service: General;  Laterality: Right;    Allergies: Patient has no known allergies.  Medications: Prior to Admission medications   Medication Sig Start Date End Date Taking? Authorizing Provider  Multiple Vitamin (MULTIVITAMIN WITH MINERALS) TABS tablet Take 1 tablet by mouth daily.    [provider]  naproxen sodium (ANAPROX) 220 MG tablet Take 220 mg by mouth 2 (two) times daily with a meal.    [provider]  omeprazole (PRILOSEC) 20 MG capsule Take 20 mg by mouth daily.    [provider]  predniSONE (DELTASONE) 20 MG tablet Take 20 mg by mouth every morning. 08/06/20   [provider]  rosuvastatin (CRESTOR) 5 MG tablet Take 5 mg by mouth daily. 08/30/20   [provider]  tamsulosin  (FLOMAX) 0.4 MG CAPS capsule TAKE ONE CAPSULE BY MOUTH EVERY DAY, START TAKING 2 WEEKS BEFORE SURGERY 11/14/14   [provider]  valsartan-hydrochlorothiazide (DIOVAN-HCT) 160-12.5 MG tablet Take 1 tablet by mouth daily. 07/27/20   [provider]     History reviewed. No pertinent family history.  Social History   Socioeconomic History   Marital status: Legally Separated    Spouse name: Not on file   Number of children: Not on file   Years of education: Not on file   Highest education level: Not on file  Occupational History   Not on file  Tobacco Use   Smoking status: Former    Types: Cigarettes    Quit date: 12/19/1969    Years since quitting: 50.9   Smokeless tobacco: Never  Substance and Sexual Activity   Alcohol use: No    Comment: stopped 1971   Drug use: No   Sexual activity: Not on file  Other Topics Concern   Not on file  Social History Narrative   Not on file   Social Determinants of Health   Financial Resource Strain: Not on file  Food Insecurity: Not on file  Transportation Needs: Not on file  Physical Activity: Not on file  Stress: Not on file  Social Connections: Not on file     Review of Systems: A 12 point ROS discussed and pertinent positives are indicated in the HPI above.  All other  systems are negative.  Review of Systems  Constitutional:  Negative for fatigue and fever.  Respiratory:  Negative for cough and shortness of breath.   Cardiovascular:  Negative for chest pain.  Gastrointestinal:  Negative for abdominal pain.  Genitourinary:  Negative for dysuria.  Musculoskeletal:  Negative for back pain.  Psychiatric/Behavioral:  Negative for behavioral problems and confusion.    Vital Signs: BP 125/82   Pulse 69   Temp 98.5 F (36.9 C) (Oral)   Resp 17   SpO2 99%   Physical Exam Vitals and nursing note reviewed.  Constitutional:      General: He is not in acute distress.    Appearance: Normal appearance. He is not  ill-appearing.  HENT:     Mouth/Throat:     Mouth: Mucous membranes are moist.     Pharynx: Oropharynx is clear.  Cardiovascular:     Rate and Rhythm: Normal rate and regular rhythm.  Pulmonary:     Effort: Pulmonary effort is normal. No respiratory distress.     Breath sounds: Normal breath sounds.  Abdominal:     General: Abdomen is flat.     Palpations: Abdomen is soft.  Skin:    General: Skin is warm and dry.  Neurological:     General: No focal deficit present.     Mental Status: He is alert and oriented to person, place, and time. Mental status is at baseline.  Psychiatric:        Mood and Affect: Mood normal.        Behavior: Behavior normal.        Thought Content: Thought content normal.        Judgment: Judgment normal.     MD Evaluation Airway: WNL Heart: WNL Abdomen: WNL Chest/ Lungs: WNL ASA  Classification: 3 Mallampati/Airway Score: Two   Imaging: No results found.  Labs:  CBC: Recent Labs    10/10/20 1152 11/21/20 0728  WBC 2.4* 4.0  HGB 12.5* 12.9*  HCT 35.8* 37.9*  PLT 115* 125*    COAGS: No results for input(s): INR, APTT in the last 8760 hours.  BMP: Recent Labs    10/10/20 1152  NA 141  K 4.2  CL 106  CO2 27  GLUCOSE 93  BUN 28*  CALCIUM 10.0  CREATININE 1.48*  GFRNONAA 50*    LIVER FUNCTION TESTS: Recent Labs    10/10/20 1152  BILITOT 0.5  AST 30  ALT 20  ALKPHOS 89  PROT 7.5  ALBUMIN 4.0    TUMOR MARKERS: No results for input(s): AFPTM, CEA, CA199, CHROMGRNA in the last 8760 hours.  Assessment and Plan: Patient with past medical history of GERD, bleeding ulcers presents with complaint of pancytopenia.  IR consulted for bone marrow biopsy at the request of Dr. Chryl Heck. Case reviewed by Dr. Kathlene Cote who approves patient for procedure.  Patient presents today in their usual state of health.  He has been NPO and is not currently on blood thinners.   Risks and benefits was discussed with the patient and/or  patient's family including, but not limited to bleeding, infection, damage to adjacent structures or low yield requiring additional tests.  All of the questions were answered and there is agreement to proceed.  Consent signed and in chart.  Thank you for this interesting consult.  I greatly enjoyed meeting [employer]. and look forward to participating in their care.  A copy of this report was sent to the requesting provider on this date.  Electronically Signed: Docia Barrier, PA 11/21/2020, 8:39 AM   I spent a total of  30 Minutes   in face to face in clinical consultation, greater than 50% of which was counseling/coordinating care for pancytopenia.

## 2020-11-21 ENCOUNTER — Ambulatory Visit (HOSPITAL_COMMUNITY)
Admission: RE | Admit: 2020-11-21 | Discharge: 2020-11-21 | Disposition: A | Discharge status: Home / Self Care | Payer: Medicare Other | Source: Ambulatory Visit | Attending: Hematology and Oncology | Admitting: Hematology and Oncology

## 2020-11-21 ENCOUNTER — Other Ambulatory Visit: Payer: Self-pay

## 2020-11-21 ENCOUNTER — Encounter (HOSPITAL_COMMUNITY): Payer: Self-pay

## 2020-11-21 DIAGNOSIS — D696 Thrombocytopenia, unspecified: Secondary | ICD-10-CM | POA: Insufficient documentation

## 2020-11-21 DIAGNOSIS — D61818 Other pancytopenia: Secondary | ICD-10-CM

## 2020-11-21 DIAGNOSIS — Z79899 Other long term (current) drug therapy: Secondary | ICD-10-CM | POA: Insufficient documentation

## 2020-11-21 DIAGNOSIS — D709 Neutropenia, unspecified: Secondary | ICD-10-CM | POA: Diagnosis not present

## 2020-11-21 DIAGNOSIS — D649 Anemia, unspecified: Secondary | ICD-10-CM | POA: Insufficient documentation

## 2020-11-21 DIAGNOSIS — K219 Gastro-esophageal reflux disease without esophagitis: Secondary | ICD-10-CM | POA: Diagnosis not present

## 2020-11-21 DIAGNOSIS — D72822 Plasmacytosis: Secondary | ICD-10-CM | POA: Diagnosis not present

## 2020-11-21 DIAGNOSIS — Z87891 Personal history of nicotine dependence: Secondary | ICD-10-CM | POA: Diagnosis not present

## 2020-11-21 DIAGNOSIS — Z7952 Long term (current) use of systemic steroids: Secondary | ICD-10-CM | POA: Insufficient documentation

## 2020-11-21 DIAGNOSIS — K274 Chronic or unspecified peptic ulcer, site unspecified, with hemorrhage: Secondary | ICD-10-CM | POA: Diagnosis not present

## 2020-11-21 LAB — CBC WITH DIFFERENTIAL/PLATELET
Abs Immature Granulocytes: 0.01 10*3/uL (ref 0.00–0.07)
Basophils Absolute: 0 10*3/uL (ref 0.0–0.1)
Basophils Relative: 1 %
Eosinophils Absolute: 0.1 10*3/uL (ref 0.0–0.5)
Eosinophils Relative: 2 %
HCT: 37.9 % — ABNORMAL LOW (ref 39.0–52.0)
Hemoglobin: 12.9 g/dL — ABNORMAL LOW (ref 13.0–17.0)
Immature Granulocytes: 0 %
Lymphocytes Relative: 64 %
Lymphs Abs: 2.6 10*3/uL (ref 0.7–4.0)
MCH: 30.8 pg (ref 26.0–34.0)
MCHC: 34 g/dL (ref 30.0–36.0)
MCV: 90.5 fL (ref 80.0–100.0)
Monocytes Absolute: 0.5 10*3/uL (ref 0.1–1.0)
Monocytes Relative: 12 %
Neutro Abs: 0.8 10*3/uL — ABNORMAL LOW (ref 1.7–7.7)
Neutrophils Relative %: 21 %
Platelets: 125 10*3/uL — ABNORMAL LOW (ref 150–400)
RBC: 4.19 MIL/uL — ABNORMAL LOW (ref 4.22–5.81)
RDW: 13 % (ref 11.5–15.5)
WBC: 4 10*3/uL (ref 4.0–10.5)
nRBC: 0 % (ref 0.0–0.2)

## 2020-11-21 MED ORDER — FENTANYL CITRATE (PF) 100 MCG/2ML IJ SOLN
INTRAMUSCULAR | Status: DC | PRN
  Administered 2020-11-21: 50 ug via INTRAVENOUS

## 2020-11-21 MED ORDER — NALOXONE HCL 0.4 MG/ML IJ SOLN
INTRAMUSCULAR | Status: AC
  Filled 2020-11-21: qty 1

## 2020-11-21 MED ORDER — MIDAZOLAM HCL 2 MG/2ML IJ SOLN
INTRAMUSCULAR | Status: AC
  Filled 2020-11-21: qty 4

## 2020-11-21 MED ORDER — MIDAZOLAM HCL 2 MG/2ML IJ SOLN
INTRAMUSCULAR | Status: DC | PRN
  Administered 2020-11-21: 1 mg via INTRAVENOUS

## 2020-11-21 MED ORDER — FENTANYL CITRATE (PF) 100 MCG/2ML IJ SOLN
INTRAMUSCULAR | Status: AC
  Filled 2020-11-21: qty 4

## 2020-11-21 MED ORDER — SODIUM CHLORIDE 0.9 % IV SOLN: INTRAVENOUS | Status: DC

## 2020-11-21 MED ORDER — FLUMAZENIL 0.5 MG/5ML IV SOLN
INTRAVENOUS | Status: AC
  Filled 2020-11-21: qty 5

## 2020-11-21 NOTE — Discharge Instructions (Signed)

## 2020-11-21 NOTE — Procedures (Signed)
Interventional Radiology Procedure Note  Procedure: CT guided bone marrow aspiration and biopsy  Complications: None  EBL: < 10 mL  Findings: Aspirate and core biopsy performed of bone marrow in right iliac bone.  Plan: Bedrest supine x 1 hrs  Logen Fowle T. Ezel Vallone, M.D Pager:  [phone]   

## 2020-11-29 ENCOUNTER — Encounter (HOSPITAL_COMMUNITY): Payer: Self-pay | Admitting: Hematology and Oncology

## 2020-12-04 NOTE — Progress Notes (Signed)
[facility] CONSULT NOTE  Patient Care Team: Lucianne Lei, MD as PCP - General (Family Medicine)  CHIEF COMPLAINTS/PURPOSE OF CONSULTATION:  Pancytopenia, initial consultation  ASSESSMENT & PLAN:   This is a very pleasant 70 year old male patient with past medical history significant for arthritis, inguinal hernia, history of bleeding ulcers referred to hematology for worsening pancytopenia.   In the past he was found to have intermittent leukopenia and mild anemia with hemoglobin ranging from 12 to 13 g/dL. I reviewed labs which showed no evidence of nutritional deficiency, mild anemia, leukopenia which is neutropenia and mild thrombocytopenia.   No clear evidence of autoimmune diseases, hypothyroidism or hemolysis, hepatitis. We recommended BMB given unexplained pancytopenia. Bone marrow biopsy showed trilineage hematopoiesis with apparent slight plasmacytosis.  No increase in blast cells.  Mild nonspecific changes particularly involving the erythroid cell line.  Features diagnostic of a myeloid neoplasm are not seen.  There is an apparent slight increase in plasma cells with lack of large aggregates or sheets.  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 At this time, since he is clinically asymptomatic and no major hematological disorder noted that requires immediate treatment, I have asked him to continue follow up with [place] periodically and report any new concerns. He expressed understanding of the recommendations and is agreeable.  HISTORY OF PRESENTING ILLNESS:  [name]. 70 y.o. male is here because of anemia and leukopenia.  This is a pleasant 70 year old male patient who goes by brother Lierman referred to hematology for evaluation of abnormal labs.    Interval History  Since last visit, patient denies any new complaints.  He continues to eat healthy, stays active.  He apparently had some blood noted in his stool so he will be getting another colonoscopy.  Besides that,  he denies any new medications, interim hospitalizations. Rest of the pertinent 10 point ROS reviewed and negative.   MEDICAL HISTORY:  Past Medical History:  Diagnosis Date   GERD (gastroesophageal reflux disease)    History of bleeding ulcers    x3   Inguinal hernia     SURGICAL HISTORY: Past Surgical History:  Procedure Laterality Date   EYE SURGERY     x7   GANGLION CYST EXCISION     left hand   INGUINAL HERNIA REPAIR Right 12/27/2014   Procedure: LAPAROSCOPIC RIGHT INGUINAL HERNIA REPAIR WITH MESH;  Surgeon: Greer Pickerel, MD;  Location: [facility];  Service: General;  Laterality: Right;   INSERTION OF MESH Right 12/27/2014   Procedure: INSERTION OF MESH;  Surgeon: Greer Pickerel, MD;  Location: [facility];  Service: General;  Laterality: Right;    SOCIAL HISTORY: Social History   Socioeconomic History   Marital status: Legally Separated    Spouse name: Not on file   Number of children: Not on file   Years of education: Not on file   Highest education level: Not on file  Occupational History   Not on file  Tobacco Use   Smoking status: Former    Types: Cigarettes    Quit date: 12/19/1969    Years since quitting: 50.9   Smokeless tobacco: Never  Substance and Sexual Activity   Alcohol use: No    Comment: stopped 1971   Drug use: No   Sexual activity: Not on file  Other Topics Concern   Not on file  Social History Narrative   Not on file   Social Determinants of Health   Financial Resource Strain: Not on file  Food Insecurity: Not  on file  Transportation Needs: Not on file  Physical Activity: Not on file  Stress: Not on file  Social Connections: Not on file  Intimate Partner Violence: Not on file    FAMILY HISTORY: No family history on file.  ALLERGIES:  has No Known Allergies.  MEDICATIONS:  Current Outpatient Medications  Medication Sig Dispense Refill   Multiple Vitamin (MULTIVITAMIN WITH MINERALS) TABS tablet Take 1 tablet by mouth daily.     naproxen  sodium (ANAPROX) 220 MG tablet Take 220 mg by mouth 2 (two) times daily with a meal.     omeprazole (PRILOSEC) 20 MG capsule Take 20 mg by mouth daily.     predniSONE (DELTASONE) 20 MG tablet Take 20 mg by mouth every morning.     rosuvastatin (CRESTOR) 5 MG tablet Take 5 mg by mouth daily.     valsartan-hydrochlorothiazide (DIOVAN-HCT) 160-12.5 MG tablet Take 1 tablet by mouth daily.     tamsulosin (FLOMAX) 0.4 MG CAPS capsule TAKE ONE CAPSULE BY MOUTH EVERY DAY, START TAKING 2 WEEKS BEFORE SURGERY (Patient not taking: Reported on 12/05/2020)  0   No current facility-administered medications for this visit.   PHYSICAL EXAMINATION: ECOG PERFORMANCE STATUS: 0 - Asymptomatic  Vitals:   12/05/20 0804  BP: 139/88  Pulse: 85  Resp: 18  Temp: 98.6 F (37 C)  SpO2: 99%    Filed Weights   12/05/20 0804  Weight: 188 lb 1.6 oz (85.3 kg)     Physical examination deferred in lieu of counseling.  LABORATORY DATA:  I have reviewed the data as listed Lab Results  Component Value Date   WBC 3.5 (L) 12/05/2020   HGB 12.2 (L) 12/05/2020   HCT 36.0 (L) 12/05/2020   MCV 89.1 12/05/2020   PLT 121 (L) 12/05/2020     Chemistry      Component Value Date/Time   NA 141 12/05/2020 0732   K 4.1 12/05/2020 0732   CL 108 12/05/2020 0732   CO2 22 12/05/2020 0732   BUN 31 (H) 12/05/2020 0732   CREATININE 1.91 (H) 12/05/2020 0732      Component Value Date/Time   CALCIUM 9.7 12/05/2020 0732   ALKPHOS 76 12/05/2020 0732   AST 31 12/05/2020 0732   ALT 21 12/05/2020 0732   BILITOT 0.4 12/05/2020 0732     I have reviewed available labs sent to [place].  He was found to have some leukopenia with neutropenia, mild anemia and mild thrombocytopenia.  Labs from 2016 also demonstrated mild leukopenia.  Labs from 2019 demonstrated mild anemia which has essentially remained stable throughout the years. CBC from today showed white blood cell count of 2400, hemoglobin of 12.5 and platelet count of 115,000,  neutropenia with ANC of 600.  Reticulocyte count of 0.8%.   Recent labs did not show any evidence of iron deficiency, E52 or folic acid deficiency. No autoimmune diseases.  No evidence of hypothyroidism, hemolysis.  Hepatitis panel is nonreactive.  RADIOGRAPHIC STUDIES:  I have personally reviewed the radiological images as listed and agreed with the findings in the report. CT BIOPSY  Result Date: 11/21/2020 CLINICAL DATA:  Pancytopenia. EXAM: CT GUIDED BONE MARROW ASPIRATION AND BIOPSY ANESTHESIA/SEDATION: Versed 2.0 mg IV, Fentanyl 50 mcg IV Total Moderate Sedation Time:   20 minutes. The patient's level of consciousness and physiologic status were continuously monitored during the procedure by Radiology nursing. PROCEDURE: The procedure risks, benefits, and alternatives were explained to the patient. Questions regarding the procedure were encouraged and answered. The  patient understands and consents to the procedure. A time out was performed prior to initiating the procedure. The right gluteal region was prepped with chlorhexidine. Sterile gown and sterile gloves were used for the procedure. Local anesthesia was provided with 1% Lidocaine. Under CT guidance, an 11 gauge On Control bone cutting needle was advanced from a posterior approach into the right iliac bone. Needle positioning was confirmed with CT. Initial non heparinized and heparinized aspirate samples were obtained of bone marrow. Core biopsy was performed via the On Control drill needle. COMPLICATIONS: None FINDINGS: Inspection of initial aspirate did reveal visible particles. Intact core biopsy sample was obtained. IMPRESSION: CT guided bone marrow biopsy of right posterior iliac bone with both aspirate and core samples obtained. Electronically Signed   By: Aletta Edouard M.D.   On: 11/21/2020 11:02   CT BONE MARROW BIOPSY & ASPIRATION  Result Date: 11/21/2020 CLINICAL DATA:  Pancytopenia. EXAM: CT GUIDED BONE MARROW ASPIRATION AND BIOPSY  ANESTHESIA/SEDATION: Versed 2.0 mg IV, Fentanyl 50 mcg IV Total Moderate Sedation Time:   20 minutes. The patient's level of consciousness and physiologic status were continuously monitored during the procedure by Radiology nursing. PROCEDURE: The procedure risks, benefits, and alternatives were explained to the patient. Questions regarding the procedure were encouraged and answered. The patient understands and consents to the procedure. A time out was performed prior to initiating the procedure. The right gluteal region was prepped with chlorhexidine. Sterile gown and sterile gloves were used for the procedure. Local anesthesia was provided with 1% Lidocaine. Under CT guidance, an 11 gauge On Control bone cutting needle was advanced from a posterior approach into the right iliac bone. Needle positioning was confirmed with CT. Initial non heparinized and heparinized aspirate samples were obtained of bone marrow. Core biopsy was performed via the On Control drill needle. COMPLICATIONS: None FINDINGS: Inspection of initial aspirate did reveal visible particles. Intact core biopsy sample was obtained. IMPRESSION: CT guided bone marrow biopsy of right posterior iliac bone with both aspirate and core samples obtained. Electronically Signed   By: Aletta Edouard M.D.   On: 11/21/2020 11:02     DIAGNOSIS:   BONE MARROW, ASPIRATE, CLOT, CORE:  -Trilineage hematopoiesis with apparent slight plasmacytosis  -See comment   PERIPHERAL BLOOD:  -Normocytic-normochromic anemia  -Neutropenia  -Thrombocytopenia   COMMENT:   The bone marrow material is generally limited but shows trilineage  hematopoiesis with progressive maturation of all myeloid cell lines with  no increase in blastic cells.  There are mild nonspecific changes  particularly involving the erythroid cell line.  Features diagnostic of  a myeloid neoplasm are not seen.  There is an apparent slight increase  in plasma cells (5%) with lack of large  aggregates or sheets.  To assess  plasma cell clonality, CD138, and in situ hybridization for kappa and  lambda will be performed and the results reported in an addendum.  Correlation with cytogenetic studies is recommended.  Cytogenetic showed male karyotype.  All questions were answered. The patient knows to call the clinic with any problems, questions or concerns. I spent 15 minutes in the care of this patient including history, review of records, counseling and coordination of care.     Benay Pike, MD 12/05/2020 9:19 AM

## 2020-12-05 ENCOUNTER — Inpatient Hospital Stay (HOSPITAL_BASED_OUTPATIENT_CLINIC_OR_DEPARTMENT_OTHER): Payer: Medicare Other | Admitting: Hematology and Oncology

## 2020-12-05 ENCOUNTER — Telehealth: Payer: Self-pay

## 2020-12-05 ENCOUNTER — Other Ambulatory Visit: Payer: Self-pay

## 2020-12-05 ENCOUNTER — Encounter: Payer: Self-pay | Admitting: Hematology and Oncology

## 2020-12-05 ENCOUNTER — Inpatient Hospital Stay: Payer: Medicare Other | Attending: Hematology and Oncology

## 2020-12-05 VITALS — BP 139/88 | HR 85 | Temp 98.6°F | Resp 18 | Ht 71.75 in | Wt 188.1 lb

## 2020-12-05 DIAGNOSIS — D709 Neutropenia, unspecified: Secondary | ICD-10-CM

## 2020-12-05 DIAGNOSIS — Z7952 Long term (current) use of systemic steroids: Secondary | ICD-10-CM | POA: Insufficient documentation

## 2020-12-05 DIAGNOSIS — Z79899 Other long term (current) drug therapy: Secondary | ICD-10-CM | POA: Insufficient documentation

## 2020-12-05 DIAGNOSIS — K219 Gastro-esophageal reflux disease without esophagitis: Secondary | ICD-10-CM | POA: Diagnosis not present

## 2020-12-05 DIAGNOSIS — D649 Anemia, unspecified: Secondary | ICD-10-CM

## 2020-12-05 DIAGNOSIS — D61818 Other pancytopenia: Secondary | ICD-10-CM | POA: Diagnosis not present

## 2020-12-05 LAB — CBC WITH DIFFERENTIAL/PLATELET
Abs Immature Granulocytes: 0.01 10*3/uL (ref 0.00–0.07)
Basophils Absolute: 0 10*3/uL (ref 0.0–0.1)
Basophils Relative: 1 %
Eosinophils Absolute: 0.1 10*3/uL (ref 0.0–0.5)
Eosinophils Relative: 2 %
HCT: 36 % — ABNORMAL LOW (ref 39.0–52.0)
Hemoglobin: 12.2 g/dL — ABNORMAL LOW (ref 13.0–17.0)
Immature Granulocytes: 0 %
Lymphocytes Relative: 64 %
Lymphs Abs: 2.2 10*3/uL (ref 0.7–4.0)
MCH: 30.2 pg (ref 26.0–34.0)
MCHC: 33.9 g/dL (ref 30.0–36.0)
MCV: 89.1 fL (ref 80.0–100.0)
Monocytes Absolute: 0.4 10*3/uL (ref 0.1–1.0)
Monocytes Relative: 12 %
Neutro Abs: 0.7 10*3/uL — ABNORMAL LOW (ref 1.7–7.7)
Neutrophils Relative %: 21 %
Platelets: 121 10*3/uL — ABNORMAL LOW (ref 150–400)
RBC: 4.04 MIL/uL — ABNORMAL LOW (ref 4.22–5.81)
RDW: 12.4 % (ref 11.5–15.5)
WBC: 3.5 10*3/uL — ABNORMAL LOW (ref 4.0–10.5)
nRBC: 0 % (ref 0.0–0.2)

## 2020-12-05 LAB — COMPREHENSIVE METABOLIC PANEL
ALT: 21 U/L (ref 0–44)
AST: 31 U/L (ref 15–41)
Albumin: 3.9 g/dL (ref 3.5–5.0)
Alkaline Phosphatase: 76 U/L (ref 38–126)
Anion gap: 11 (ref 5–15)
BUN: 31 mg/dL — ABNORMAL HIGH (ref 8–23)
CO2: 22 mmol/L (ref 22–32)
Calcium: 9.7 mg/dL (ref 8.9–10.3)
Chloride: 108 mmol/L (ref 98–111)
Creatinine, Ser: 1.91 mg/dL — ABNORMAL HIGH (ref 0.61–1.24)
GFR, Estimated: 37 mL/min — ABNORMAL LOW (ref >60)
Glucose, Bld: 91 mg/dL (ref 70–99)
Potassium: 4.1 mmol/L (ref 3.5–5.1)
Sodium: 141 mmol/L (ref 135–145)
Total Bilirubin: 0.4 mg/dL (ref 0.3–1.2)
Total Protein: 7.2 g/dL (ref 6.5–8.1)

## 2020-12-05 LAB — TSH: TSH: 1.754 u[IU]/mL (ref 0.320–4.118)

## 2020-12-05 NOTE — Telephone Encounter (Signed)
Called and spoke with patient regarding lab results. Informed him that per Dr Chryl Heck, his creatinine was elevated. Advised him to follow up with his PCP. Patient verbalized understanding.

## 2020-12-06 DIAGNOSIS — R195 Other fecal abnormalities: Secondary | ICD-10-CM | POA: Diagnosis not present

## 2020-12-06 DIAGNOSIS — D559 Anemia due to enzyme disorder, unspecified: Secondary | ICD-10-CM | POA: Diagnosis not present

## 2020-12-06 DIAGNOSIS — R7303 Prediabetes: Secondary | ICD-10-CM | POA: Diagnosis not present

## 2020-12-06 DIAGNOSIS — E1169 Type 2 diabetes mellitus with other specified complication: Secondary | ICD-10-CM | POA: Diagnosis not present

## 2020-12-06 DIAGNOSIS — I129 Hypertensive chronic kidney disease with stage 1 through stage 4 chronic kidney disease, or unspecified chronic kidney disease: Secondary | ICD-10-CM | POA: Diagnosis not present

## 2020-12-06 DIAGNOSIS — I10 Essential (primary) hypertension: Secondary | ICD-10-CM | POA: Diagnosis not present

## 2020-12-06 DIAGNOSIS — E782 Mixed hyperlipidemia: Secondary | ICD-10-CM | POA: Diagnosis not present

## 2020-12-11 DIAGNOSIS — K219 Gastro-esophageal reflux disease without esophagitis: Secondary | ICD-10-CM | POA: Diagnosis not present

## 2020-12-11 DIAGNOSIS — I10 Essential (primary) hypertension: Secondary | ICD-10-CM | POA: Diagnosis not present

## 2020-12-11 DIAGNOSIS — R195 Other fecal abnormalities: Secondary | ICD-10-CM | POA: Diagnosis not present

## 2020-12-16 LAB — SURGICAL PATHOLOGY

## 2020-12-19 ENCOUNTER — Other Ambulatory Visit: Payer: Self-pay | Admitting: Hematology and Oncology

## 2020-12-19 NOTE — Progress Notes (Signed)
Spoke to [facility], to add [facility] FISH panel on 12/19/2020. Previously talked to someone from [facility] about 10 days ago, but Estill Bamberg mentioned this was not added.  Iram Astorino

## 2020-12-27 ENCOUNTER — Encounter (HOSPITAL_COMMUNITY): Payer: Self-pay | Admitting: Hematology and Oncology

## 2020-12-31 DIAGNOSIS — D123 Benign neoplasm of transverse colon: Secondary | ICD-10-CM | POA: Diagnosis not present

## 2020-12-31 DIAGNOSIS — R195 Other fecal abnormalities: Secondary | ICD-10-CM | POA: Diagnosis not present

## 2020-12-31 DIAGNOSIS — K635 Polyp of colon: Secondary | ICD-10-CM | POA: Diagnosis not present

## 2020-12-31 DIAGNOSIS — D120 Benign neoplasm of cecum: Secondary | ICD-10-CM | POA: Diagnosis not present

## 2021-01-13 DIAGNOSIS — E1122 Type 2 diabetes mellitus with diabetic chronic kidney disease: Secondary | ICD-10-CM | POA: Diagnosis not present

## 2021-01-13 DIAGNOSIS — N189 Chronic kidney disease, unspecified: Secondary | ICD-10-CM | POA: Diagnosis not present

## 2021-01-13 DIAGNOSIS — I129 Hypertensive chronic kidney disease with stage 1 through stage 4 chronic kidney disease, or unspecified chronic kidney disease: Secondary | ICD-10-CM | POA: Diagnosis not present

## 2021-01-17 DIAGNOSIS — I10 Essential (primary) hypertension: Secondary | ICD-10-CM | POA: Diagnosis not present

## 2021-01-17 DIAGNOSIS — E782 Mixed hyperlipidemia: Secondary | ICD-10-CM | POA: Diagnosis not present

## 2021-04-13 DIAGNOSIS — N189 Chronic kidney disease, unspecified: Secondary | ICD-10-CM | POA: Diagnosis not present

## 2021-04-13 DIAGNOSIS — E1122 Type 2 diabetes mellitus with diabetic chronic kidney disease: Secondary | ICD-10-CM | POA: Diagnosis not present

## 2021-04-13 DIAGNOSIS — I129 Hypertensive chronic kidney disease with stage 1 through stage 4 chronic kidney disease, or unspecified chronic kidney disease: Secondary | ICD-10-CM | POA: Diagnosis not present

## 2021-05-15 DIAGNOSIS — I129 Hypertensive chronic kidney disease with stage 1 through stage 4 chronic kidney disease, or unspecified chronic kidney disease: Secondary | ICD-10-CM | POA: Diagnosis not present

## 2021-05-15 DIAGNOSIS — E782 Mixed hyperlipidemia: Secondary | ICD-10-CM | POA: Diagnosis not present

## 2021-05-15 DIAGNOSIS — N189 Chronic kidney disease, unspecified: Secondary | ICD-10-CM | POA: Diagnosis not present

## 2021-05-15 DIAGNOSIS — E1122 Type 2 diabetes mellitus with diabetic chronic kidney disease: Secondary | ICD-10-CM | POA: Diagnosis not present

## 2021-05-16 DIAGNOSIS — Z0001 Encounter for general adult medical examination with abnormal findings: Secondary | ICD-10-CM | POA: Diagnosis not present

## 2021-05-16 DIAGNOSIS — I129 Hypertensive chronic kidney disease with stage 1 through stage 4 chronic kidney disease, or unspecified chronic kidney disease: Secondary | ICD-10-CM | POA: Diagnosis not present

## 2021-05-16 DIAGNOSIS — E1122 Type 2 diabetes mellitus with diabetic chronic kidney disease: Secondary | ICD-10-CM | POA: Diagnosis not present

## 2021-07-13 DIAGNOSIS — I129 Hypertensive chronic kidney disease with stage 1 through stage 4 chronic kidney disease, or unspecified chronic kidney disease: Secondary | ICD-10-CM | POA: Diagnosis not present

## 2021-07-13 DIAGNOSIS — E1122 Type 2 diabetes mellitus with diabetic chronic kidney disease: Secondary | ICD-10-CM | POA: Diagnosis not present

## 2021-09-12 DIAGNOSIS — I129 Hypertensive chronic kidney disease with stage 1 through stage 4 chronic kidney disease, or unspecified chronic kidney disease: Secondary | ICD-10-CM | POA: Diagnosis not present

## 2021-09-12 DIAGNOSIS — E1122 Type 2 diabetes mellitus with diabetic chronic kidney disease: Secondary | ICD-10-CM | POA: Diagnosis not present

## 2021-10-13 DIAGNOSIS — E1122 Type 2 diabetes mellitus with diabetic chronic kidney disease: Secondary | ICD-10-CM | POA: Diagnosis not present

## 2021-10-13 DIAGNOSIS — N189 Chronic kidney disease, unspecified: Secondary | ICD-10-CM | POA: Diagnosis not present

## 2021-10-13 DIAGNOSIS — I129 Hypertensive chronic kidney disease with stage 1 through stage 4 chronic kidney disease, or unspecified chronic kidney disease: Secondary | ICD-10-CM | POA: Diagnosis not present

## 2021-12-30 DIAGNOSIS — H04123 Dry eye syndrome of bilateral lacrimal glands: Secondary | ICD-10-CM | POA: Diagnosis not present

## 2021-12-30 DIAGNOSIS — H25813 Combined forms of age-related cataract, bilateral: Secondary | ICD-10-CM | POA: Diagnosis not present

## 2021-12-30 DIAGNOSIS — H35033 Hypertensive retinopathy, bilateral: Secondary | ICD-10-CM | POA: Diagnosis not present

## 2021-12-30 DIAGNOSIS — H52221 Regular astigmatism, right eye: Secondary | ICD-10-CM | POA: Diagnosis not present

## 2021-12-30 DIAGNOSIS — H524 Presbyopia: Secondary | ICD-10-CM | POA: Diagnosis not present

## 2021-12-30 DIAGNOSIS — H40013 Open angle with borderline findings, low risk, bilateral: Secondary | ICD-10-CM | POA: Diagnosis not present

## 2021-12-30 DIAGNOSIS — H5203 Hypermetropia, bilateral: Secondary | ICD-10-CM | POA: Diagnosis not present

## 2021-12-30 DIAGNOSIS — H4322 Crystalline deposits in vitreous body, left eye: Secondary | ICD-10-CM | POA: Diagnosis not present

## 2022-02-25 DIAGNOSIS — E559 Vitamin D deficiency, unspecified: Secondary | ICD-10-CM | POA: Diagnosis not present

## 2022-02-25 DIAGNOSIS — E1169 Type 2 diabetes mellitus with other specified complication: Secondary | ICD-10-CM | POA: Diagnosis not present

## 2022-02-25 DIAGNOSIS — E785 Hyperlipidemia, unspecified: Secondary | ICD-10-CM | POA: Diagnosis not present

## 2022-02-25 DIAGNOSIS — I10 Essential (primary) hypertension: Secondary | ICD-10-CM | POA: Diagnosis not present

## 2022-02-25 DIAGNOSIS — N183 Chronic kidney disease, stage 3 unspecified: Secondary | ICD-10-CM | POA: Diagnosis not present

## 2022-03-14 DIAGNOSIS — N189 Chronic kidney disease, unspecified: Secondary | ICD-10-CM | POA: Diagnosis not present

## 2022-03-14 DIAGNOSIS — E1122 Type 2 diabetes mellitus with diabetic chronic kidney disease: Secondary | ICD-10-CM | POA: Diagnosis not present

## 2022-03-14 DIAGNOSIS — I129 Hypertensive chronic kidney disease with stage 1 through stage 4 chronic kidney disease, or unspecified chronic kidney disease: Secondary | ICD-10-CM | POA: Diagnosis not present

## 2022-04-14 DIAGNOSIS — I129 Hypertensive chronic kidney disease with stage 1 through stage 4 chronic kidney disease, or unspecified chronic kidney disease: Secondary | ICD-10-CM | POA: Diagnosis not present

## 2022-04-14 DIAGNOSIS — N189 Chronic kidney disease, unspecified: Secondary | ICD-10-CM | POA: Diagnosis not present

## 2022-04-14 DIAGNOSIS — E1122 Type 2 diabetes mellitus with diabetic chronic kidney disease: Secondary | ICD-10-CM | POA: Diagnosis not present

## 2022-05-14 DIAGNOSIS — I129 Hypertensive chronic kidney disease with stage 1 through stage 4 chronic kidney disease, or unspecified chronic kidney disease: Secondary | ICD-10-CM | POA: Diagnosis not present

## 2022-05-14 DIAGNOSIS — N189 Chronic kidney disease, unspecified: Secondary | ICD-10-CM | POA: Diagnosis not present

## 2022-05-14 DIAGNOSIS — E1122 Type 2 diabetes mellitus with diabetic chronic kidney disease: Secondary | ICD-10-CM | POA: Diagnosis not present

## 2022-05-26 DIAGNOSIS — E782 Mixed hyperlipidemia: Secondary | ICD-10-CM | POA: Diagnosis not present

## 2022-05-26 DIAGNOSIS — R7309 Other abnormal glucose: Secondary | ICD-10-CM | POA: Diagnosis not present

## 2022-05-26 DIAGNOSIS — I10 Essential (primary) hypertension: Secondary | ICD-10-CM | POA: Diagnosis not present

## 2022-06-02 IMAGING — CT CT BIOPSY AND ASPIRATION BONE MARROW
1 of 2 series · 15 of 28 positions shown, 19 images · non-contrast
Comparison: none

CLINICAL DATA: Pancytopenia.

[Series 2: i-spiral 5.0 br40 · axial · 0.71mm/px · z∈[-106,-28]mm · 15 of 26 slices shown, 19 images]
[im 2/26  mediastinal]
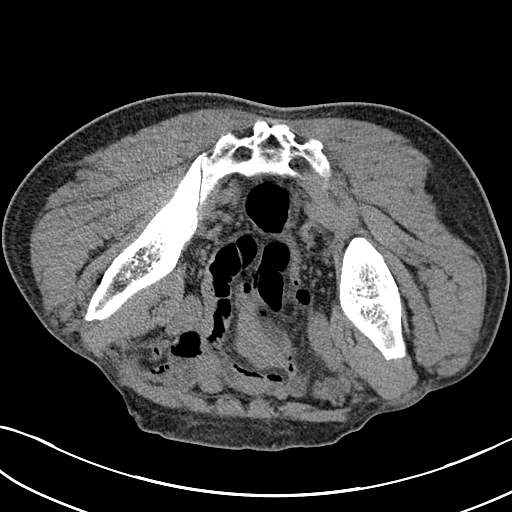
[im 2/26  lung]
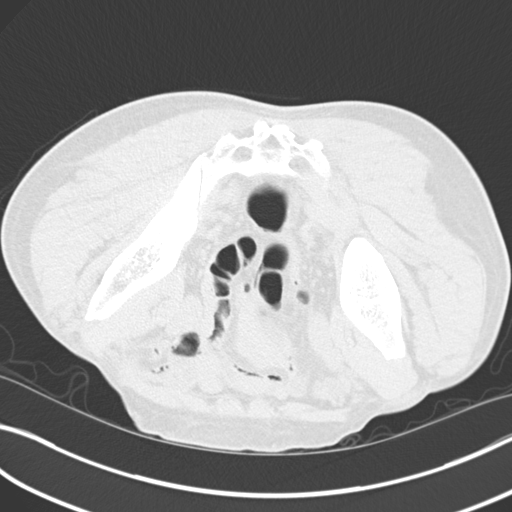
[im 4/26  lung]
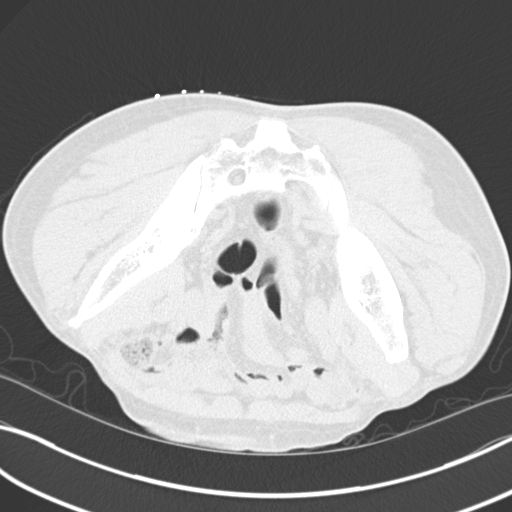
[im 5/26  lung]
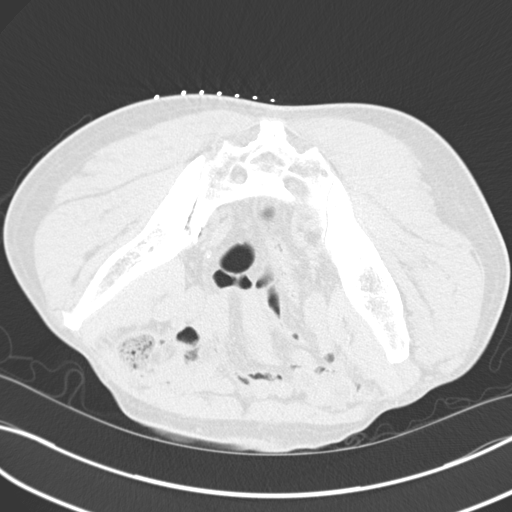
[im 6/26  lung]
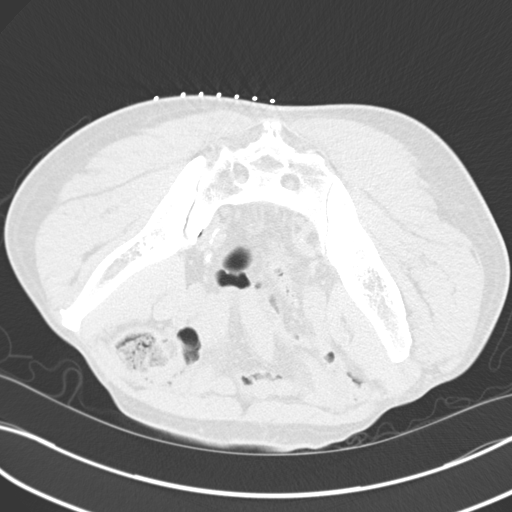
[im 8/26  mediastinal]
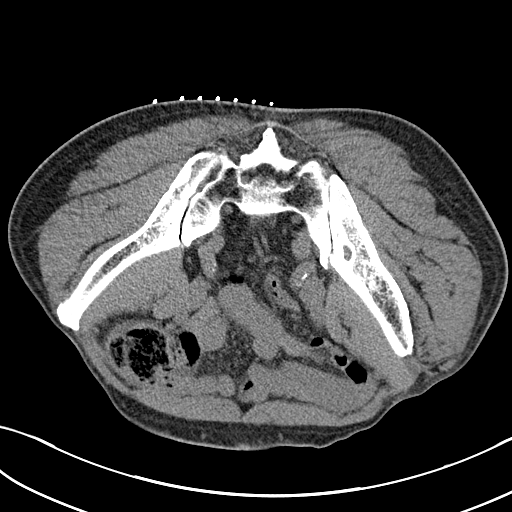
[im 8/26  lung]
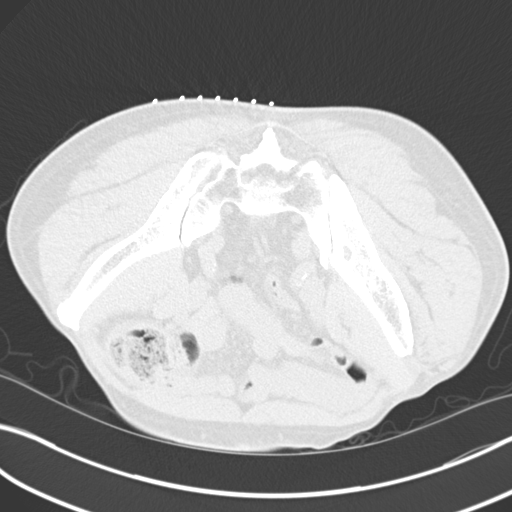
[im 10/26  lung]
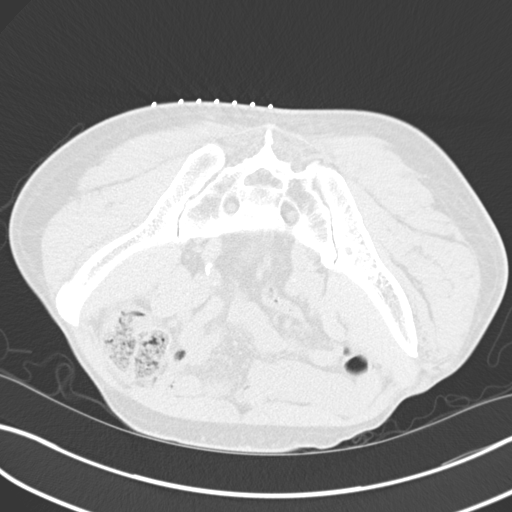
[im 12/26  lung]
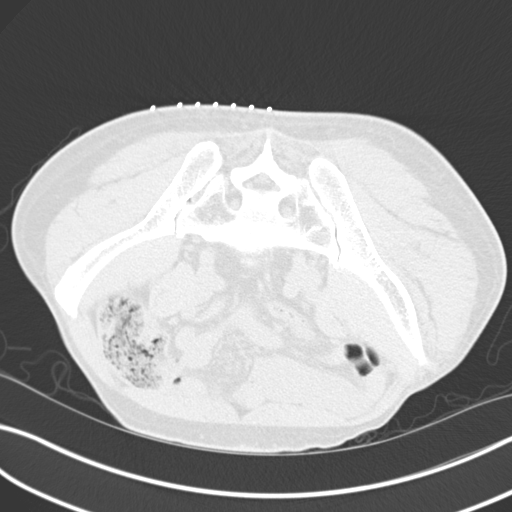
[im 13/26  lung]
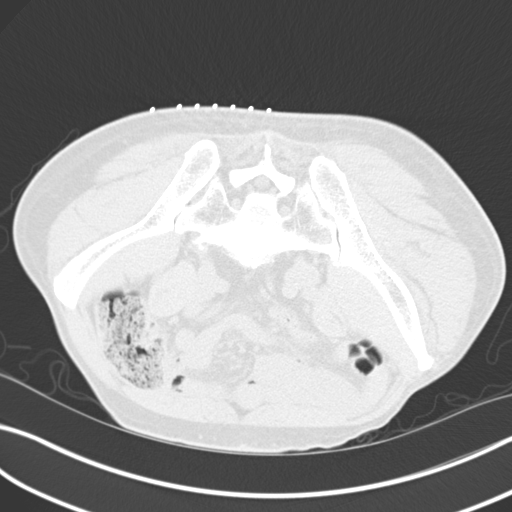
[im 14/26  mediastinal]
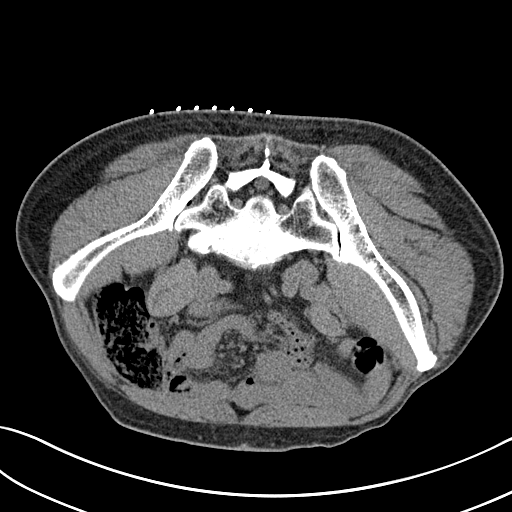
[im 14/26  lung]
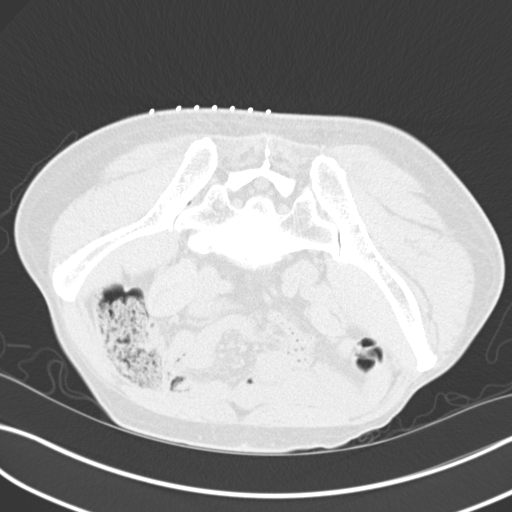
[im 16/26  lung]
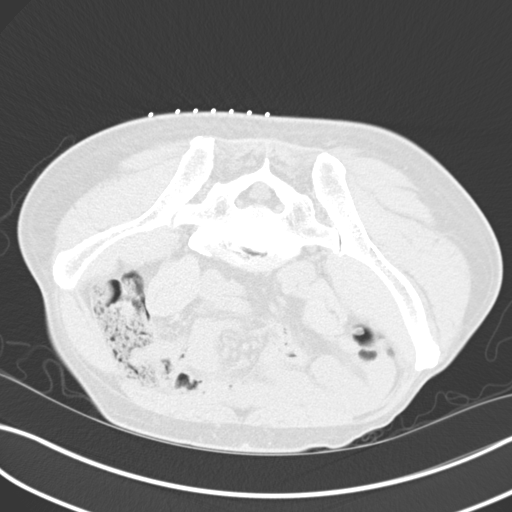
[im 18/26  lung]
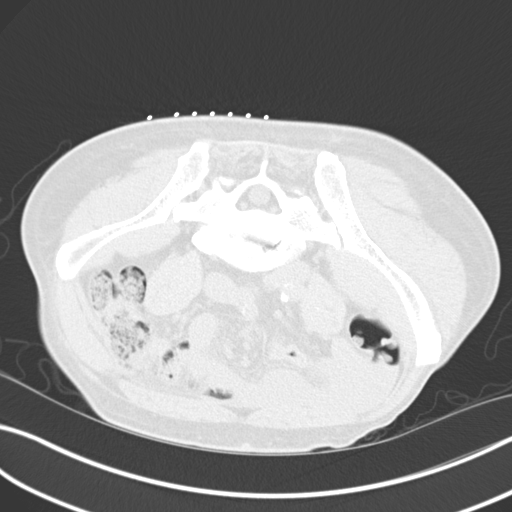
[im 20/26  lung]
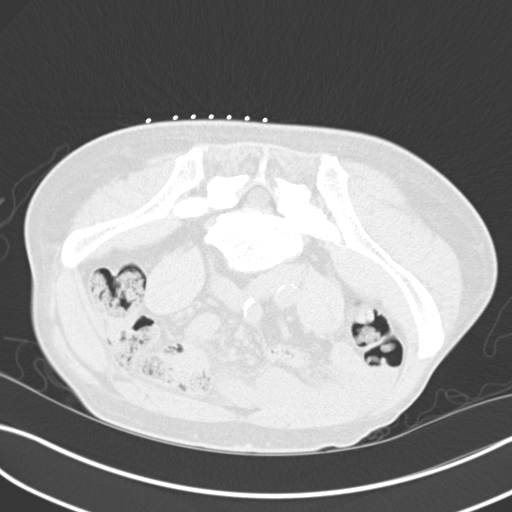
[im 21/26  mediastinal]
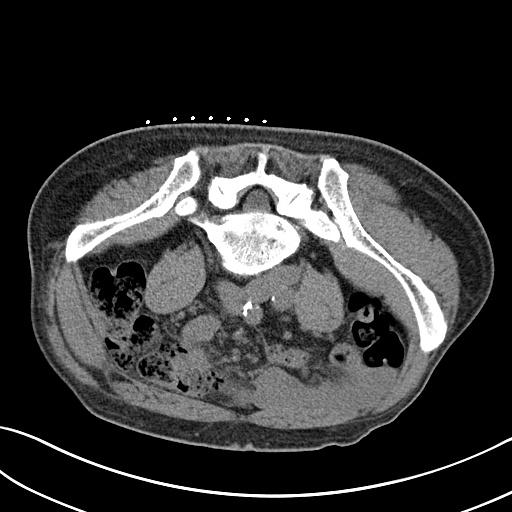
[im 21/26  lung]
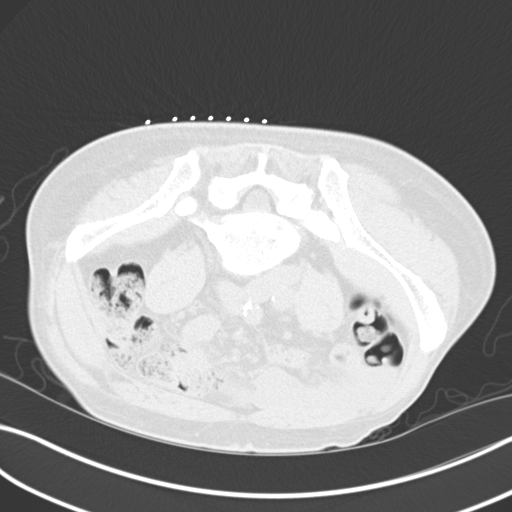
[im 22/26  lung]
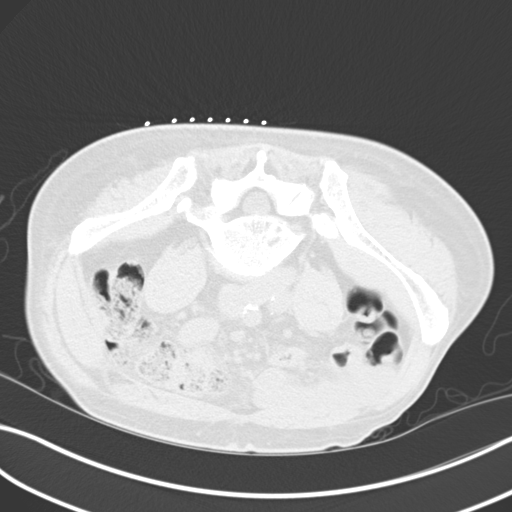
[im 24/26  lung]
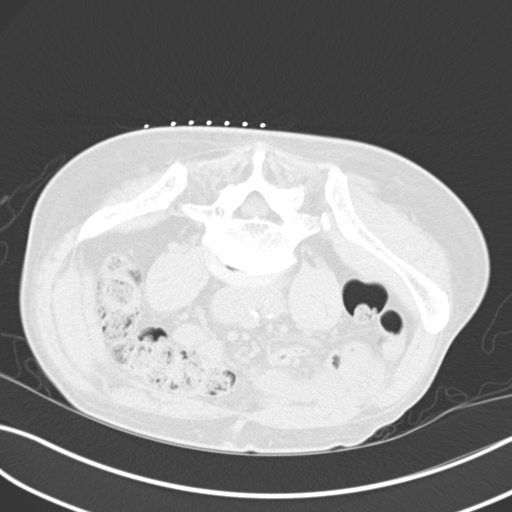

[15 of 28 positions shown; findings below may reference images not displayed]

EXAM:
CT GUIDED BONE MARROW ASPIRATION AND BIOPSY

ANESTHESIA/SEDATION:
Versed 2.0 mg IV, Fentanyl 50 mcg IV

Total Moderate Sedation Time:   20 minutes.

The patient's level of consciousness and physiologic status were
continuously monitored during the procedure by Radiology nursing.

PROCEDURE:
The procedure risks, benefits, and alternatives were explained to
the patient. Questions regarding the procedure were encouraged and
answered. The patient understands and consents to the procedure. A
time out was performed prior to initiating the procedure.

The right gluteal region was prepped with chlorhexidine. Sterile
gown and sterile gloves were used for the procedure. Local
anesthesia was provided with 1% Lidocaine.

Under CT guidance, an 11 gauge On Control bone cutting needle was
advanced from a posterior approach into the right iliac bone. Needle
positioning was confirmed with CT. Initial non heparinized and
heparinized aspirate samples were obtained of bone marrow. Core
biopsy was performed via the On Control drill needle.

COMPLICATIONS:
None
FINDINGS: Inspection of initial aspirate did reveal visible particles. Intact
core biopsy sample was obtained.
IMPRESSION: CT guided bone marrow biopsy of right posterior iliac bone with both
aspirate and core samples obtained.

## 2022-09-25 DIAGNOSIS — E782 Mixed hyperlipidemia: Secondary | ICD-10-CM | POA: Diagnosis not present

## 2022-09-25 DIAGNOSIS — R7303 Prediabetes: Secondary | ICD-10-CM | POA: Diagnosis not present

## 2022-09-25 DIAGNOSIS — E1169 Type 2 diabetes mellitus with other specified complication: Secondary | ICD-10-CM | POA: Diagnosis not present

## 2022-09-25 DIAGNOSIS — E559 Vitamin D deficiency, unspecified: Secondary | ICD-10-CM | POA: Diagnosis not present

## 2022-09-25 DIAGNOSIS — I10 Essential (primary) hypertension: Secondary | ICD-10-CM | POA: Diagnosis not present

## 2022-10-01 DIAGNOSIS — R799 Abnormal finding of blood chemistry, unspecified: Secondary | ICD-10-CM | POA: Diagnosis not present

## 2022-12-17 DIAGNOSIS — H53003 Unspecified amblyopia, bilateral: Secondary | ICD-10-CM | POA: Diagnosis not present

## 2022-12-17 DIAGNOSIS — R350 Frequency of micturition: Secondary | ICD-10-CM | POA: Diagnosis not present

## 2022-12-17 DIAGNOSIS — E785 Hyperlipidemia, unspecified: Secondary | ICD-10-CM | POA: Diagnosis not present

## 2022-12-17 DIAGNOSIS — Z79899 Other long term (current) drug therapy: Secondary | ICD-10-CM | POA: Diagnosis not present

## 2022-12-17 DIAGNOSIS — Z8711 Personal history of peptic ulcer disease: Secondary | ICD-10-CM | POA: Diagnosis not present

## 2022-12-17 DIAGNOSIS — Z1159 Encounter for screening for other viral diseases: Secondary | ICD-10-CM | POA: Diagnosis not present

## 2022-12-17 DIAGNOSIS — Z8719 Personal history of other diseases of the digestive system: Secondary | ICD-10-CM | POA: Diagnosis not present

## 2022-12-17 DIAGNOSIS — I10 Essential (primary) hypertension: Secondary | ICD-10-CM | POA: Diagnosis not present

## 2022-12-17 DIAGNOSIS — Z0000 Encounter for general adult medical examination without abnormal findings: Secondary | ICD-10-CM | POA: Diagnosis not present

## 2022-12-17 DIAGNOSIS — Z136 Encounter for screening for cardiovascular disorders: Secondary | ICD-10-CM | POA: Diagnosis not present

## 2022-12-25 DIAGNOSIS — Z8711 Personal history of peptic ulcer disease: Secondary | ICD-10-CM | POA: Diagnosis not present

## 2022-12-25 DIAGNOSIS — Z0001 Encounter for general adult medical examination with abnormal findings: Secondary | ICD-10-CM | POA: Diagnosis not present

## 2022-12-25 DIAGNOSIS — Z8719 Personal history of other diseases of the digestive system: Secondary | ICD-10-CM | POA: Diagnosis not present

## 2022-12-25 DIAGNOSIS — K219 Gastro-esophageal reflux disease without esophagitis: Secondary | ICD-10-CM | POA: Diagnosis not present

## 2022-12-25 DIAGNOSIS — K4090 Unilateral inguinal hernia, without obstruction or gangrene, not specified as recurrent: Secondary | ICD-10-CM | POA: Diagnosis not present

## 2022-12-25 DIAGNOSIS — D6949 Other primary thrombocytopenia: Secondary | ICD-10-CM | POA: Diagnosis not present

## 2022-12-25 DIAGNOSIS — E785 Hyperlipidemia, unspecified: Secondary | ICD-10-CM | POA: Diagnosis not present

## 2022-12-25 DIAGNOSIS — H53003 Unspecified amblyopia, bilateral: Secondary | ICD-10-CM | POA: Diagnosis not present

## 2022-12-25 DIAGNOSIS — N1832 Chronic kidney disease, stage 3b: Secondary | ICD-10-CM | POA: Diagnosis not present

## 2023-01-06 DIAGNOSIS — H4322 Crystalline deposits in vitreous body, left eye: Secondary | ICD-10-CM | POA: Diagnosis not present

## 2023-01-06 DIAGNOSIS — H25813 Combined forms of age-related cataract, bilateral: Secondary | ICD-10-CM | POA: Diagnosis not present

## 2023-01-06 DIAGNOSIS — H04123 Dry eye syndrome of bilateral lacrimal glands: Secondary | ICD-10-CM | POA: Diagnosis not present

## 2023-01-06 DIAGNOSIS — H35033 Hypertensive retinopathy, bilateral: Secondary | ICD-10-CM | POA: Diagnosis not present

## 2023-01-06 DIAGNOSIS — H40013 Open angle with borderline findings, low risk, bilateral: Secondary | ICD-10-CM | POA: Diagnosis not present

## 2023-01-07 ENCOUNTER — Ambulatory Visit (INDEPENDENT_AMBULATORY_CARE_PROVIDER_SITE_OTHER): Payer: Medicare Other | Admitting: Podiatry

## 2023-01-07 ENCOUNTER — Encounter: Payer: Self-pay | Admitting: Podiatry

## 2023-01-07 DIAGNOSIS — B351 Tinea unguium: Secondary | ICD-10-CM | POA: Diagnosis not present

## 2023-01-07 DIAGNOSIS — L84 Corns and callosities: Secondary | ICD-10-CM

## 2023-01-07 DIAGNOSIS — M79674 Pain in right toe(s): Secondary | ICD-10-CM | POA: Diagnosis not present

## 2023-01-07 DIAGNOSIS — M21612 Bunion of left foot: Secondary | ICD-10-CM | POA: Diagnosis not present

## 2023-01-07 DIAGNOSIS — M21611 Bunion of right foot: Secondary | ICD-10-CM

## 2023-01-07 DIAGNOSIS — M79675 Pain in left toe(s): Secondary | ICD-10-CM | POA: Diagnosis not present

## 2023-01-07 DIAGNOSIS — M2011 Hallux valgus (acquired), right foot: Secondary | ICD-10-CM

## 2023-01-07 DIAGNOSIS — M2012 Hallux valgus (acquired), left foot: Secondary | ICD-10-CM

## 2023-01-07 DIAGNOSIS — M2042 Other hammer toe(s) (acquired), left foot: Secondary | ICD-10-CM

## 2023-01-07 DIAGNOSIS — M2041 Other hammer toe(s) (acquired), right foot: Secondary | ICD-10-CM | POA: Diagnosis not present

## 2023-01-07 NOTE — Progress Notes (Signed)
Subjective:  Patient ID: Austin Rua., male    DOB: 01-02-1951,  MRN: [id number]  Chief Complaint  Patient presents with   Callouses    Sub second callus. Patient has bunion with hammertoes and crossover deformities however this does not bother him significantly, sent by PCP for foot checkup- Diamantina Monks [address] [place], [place], [phone]    Discussed the use of AI scribe software for clinical note transcription with the patient, who gave verbal consent to proceed.  History of Present Illness   The patient presents for foot care, specifically for assistance with trimming his toenails. He reports pain in his right foot and has a corn under his second toe. He has been managing the corn with over-the-counter pads and has been attempting to shave it down. He also has a bunion on his right foot, which is not causing him any discomfort. He has been managing the bunion with over-the-counter pads. He also reports having hammer toes, which are not causing him any discomfort. He has been managing the hammer toes with over-the-counter pads. He has thick, discolored toenails, which he believes to be due to a fungal infection. He has been managing the fungal infection with over-the-counter topical medication. He also reports using cocoa butter on his feet to keep them soft. He has a history of diabetes, but it is unclear if he is currently being treated for it. He also reports having dentures, which cause dryness in his mouth. He manages the dryness by chewing gum.          Objective:    Physical Exam   EXTREMITIES: Palpable pulses, warm and well-perfused. MUSCULOSKELETAL: Severe hallux valgus, bunion formation over second toe deformity, limited range of motion but nonpainful. SKIN: Thickening, elongated, dystrophic mycotic toenails with nail fungus causing thick, brown, and discolored appearance.       No images are attached to the encounter.    Results          Assessment:    1. Pain due to onychomycosis of toenails of both feet   2. Hallux valgus with bunions, left   3. Hallux valgus with bunions, right   4. Hammertoe of left foot   5. Hammertoe of right foot   6. Callus of foot      Plan:  Patient was evaluated and treated and all questions answered.  Assessment and Plan    Foot Deformities Severe hallux valgus and hammertoe of the crossover second toe deformity present, both nonpainful with some limited range of motion. Current non operative management will continue as it is nonpainful.  Submetatarsal Callus Bilateral submetatarsal 2 callus is managed with pads and shaving, which will continue.  Onychomycosis Thickened, elongated, dystrophic, mycotic toenails on all toes were debrided and length and thickness to reduce pain and improve function during the visit. Over-the-counter topical medication management will continue.  General Health Maintenance Monitoring for any new problems will continue with follow-up as needed.          Return if symptoms worsen or fail to improve.

## 2023-01-07 NOTE — Patient Instructions (Signed)
VISIT SUMMARY:  You came in today for foot care, including assistance with trimming your toenails. You reported pain in your right foot due to a corn under your second toe, and you have been managing it with over-the-counter pads. You also have a bunion and hammer toes on your right foot, which are not causing discomfort and are being managed with pads. Additionally, you have thick, discolored toenails likely due to a fungal infection, which you have been treating with over-the-counter topical medication. You also mentioned having a history of diabetes and using dentures, which cause dryness in your mouth that you manage by chewing gum.  YOUR PLAN:  -FOOT DEFORMITIES: You have severe hallux valgus (a bunion) and hammertoe deformity in your second toe. These are not causing you pain, so we will continue with your current management using over-the-counter pads.  -SUBMETATARSAL CALLUS: You have calluses under the second metatarsal bones of both feet. You are managing these with pads and shaving, and this will continue.  -ONYCHOMYCOSIS: You have a fungal infection in your toenails, causing them to be thick and discolored. We trimmed your toenails during the visit, and you should continue using the over-the-counter topical medication.  -GENERAL HEALTH MAINTENANCE: We will continue to monitor for any new problems and follow up as needed.  INSTRUCTIONS:  Please continue with your current management for your foot conditions using over-the-counter pads and topical medications. If you notice any new problems or if your current symptoms worsen, please schedule a follow-up appointment.

## 2023-01-27 ENCOUNTER — Other Ambulatory Visit: Payer: Self-pay | Admitting: *Deleted

## 2023-01-27 DIAGNOSIS — D61818 Other pancytopenia: Secondary | ICD-10-CM

## 2023-01-28 ENCOUNTER — Inpatient Hospital Stay: Payer: Medicare Other | Attending: Hematology and Oncology

## 2023-01-28 ENCOUNTER — Inpatient Hospital Stay: Payer: Medicare Other | Admitting: Hematology and Oncology

## 2023-01-28 VITALS — BP 111/65 | HR 66 | Temp 98.4°F | Resp 16 | Wt 184.7 lb

## 2023-01-28 DIAGNOSIS — D61818 Other pancytopenia: Secondary | ICD-10-CM | POA: Diagnosis present

## 2023-01-28 DIAGNOSIS — D696 Thrombocytopenia, unspecified: Secondary | ICD-10-CM | POA: Diagnosis not present

## 2023-01-28 DIAGNOSIS — D709 Neutropenia, unspecified: Secondary | ICD-10-CM | POA: Insufficient documentation

## 2023-01-28 DIAGNOSIS — D649 Anemia, unspecified: Secondary | ICD-10-CM | POA: Insufficient documentation

## 2023-01-28 LAB — CBC WITH DIFFERENTIAL (CANCER CENTER ONLY)
Abs Immature Granulocytes: 0.01 10*3/uL (ref 0.00–0.07)
Basophils Absolute: 0 10*3/uL (ref 0.0–0.1)
Basophils Relative: 1 %
Eosinophils Absolute: 0.1 10*3/uL (ref 0.0–0.5)
Eosinophils Relative: 1 %
HCT: 37.1 % — ABNORMAL LOW (ref 39.0–52.0)
Hemoglobin: 13 g/dL (ref 13.0–17.0)
Immature Granulocytes: 0 %
Lymphocytes Relative: 59 %
Lymphs Abs: 2.2 10*3/uL (ref 0.7–4.0)
MCH: 31.6 pg (ref 26.0–34.0)
MCHC: 35 g/dL (ref 30.0–36.0)
MCV: 90.3 fL (ref 80.0–100.0)
Monocytes Absolute: 0.5 10*3/uL (ref 0.1–1.0)
Monocytes Relative: 14 %
Neutro Abs: 1 10*3/uL — ABNORMAL LOW (ref 1.7–7.7)
Neutrophils Relative %: 25 %
Platelet Count: 127 10*3/uL — ABNORMAL LOW (ref 150–400)
RBC: 4.11 MIL/uL — ABNORMAL LOW (ref 4.22–5.81)
RDW: 12.6 % (ref 11.5–15.5)
Smear Review: NORMAL
WBC Count: 3.8 10*3/uL — ABNORMAL LOW (ref 4.0–10.5)
nRBC: 0 % (ref 0.0–0.2)

## 2023-01-28 LAB — CMP (CANCER CENTER ONLY)
ALT: 19 U/L (ref 0–44)
AST: 30 U/L (ref 15–41)
Albumin: 4.2 g/dL (ref 3.5–5.0)
Alkaline Phosphatase: 66 U/L (ref 38–126)
Anion gap: 4 — ABNORMAL LOW (ref 5–15)
BUN: 34 mg/dL — ABNORMAL HIGH (ref 8–23)
CO2: 32 mmol/L (ref 22–32)
Calcium: 10.3 mg/dL (ref 8.9–10.3)
Chloride: 106 mmol/L (ref 98–111)
Creatinine: 1.65 mg/dL — ABNORMAL HIGH (ref 0.61–1.24)
GFR, Estimated: 43 mL/min — ABNORMAL LOW (ref >60)
Glucose, Bld: 89 mg/dL (ref 70–99)
Potassium: 4.2 mmol/L (ref 3.5–5.1)
Sodium: 142 mmol/L (ref 135–145)
Total Bilirubin: 0.5 mg/dL (ref <1.2)
Total Protein: 7.6 g/dL (ref 6.5–8.1)

## 2023-01-28 LAB — LACTATE DEHYDROGENASE: LDH: 202 U/L — ABNORMAL HIGH (ref 98–192)

## 2023-01-28 NOTE — Progress Notes (Signed)
[facility] CONSULT NOTE  Patient Care Team: Renaye Rakers, MD as PCP - General (Family Medicine)  CHIEF COMPLAINTS/PURPOSE OF CONSULTATION:  Pancytopenia, initial consultation  ASSESSMENT & PLAN:   This is a very pleasant 72 year old male patient with past medical history significant for arthritis, inguinal hernia, history of bleeding ulcers referred to hematology for worsening pancytopenia.   In the past he was found to have intermittent leukopenia and mild anemia with hemoglobin ranging from 12 to 13 g/dL. I reviewed labs which showed no evidence of nutritional deficiency, mild anemia, leukopenia which is neutropenia and mild thrombocytopenia.   No clear evidence of autoimmune diseases, hypothyroidism or hemolysis, hepatitis. We recommended BMB given unexplained pancytopenia. Bone marrow biopsy showed trilineage hematopoiesis with apparent slight plasmacytosis.  No increase in blast cells.  Mild nonspecific changes particularly involving the erythroid cell line.  Features diagnostic of a myeloid neoplasm are not seen.  There is an apparent slight increase in plasma cells with lack of large aggregates or sheets.  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  He is here for FU per his MD's recommendations. He says he feels pretty good. He continues to work. No new B symptoms. No recent infections. PE unremarkable. No hepatosplenomegaly or lymphadenopathy. I have reviewed his labs from his PCPs office, he continues to have mild leukopenia and neutropenia.  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  His anemia and thrombocytopenia appear to have resolved.  Labs from today are pending.  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    He is in agreement with  this plan.   HISTORY OF PRESENTING ILLNESS:  Austin Owens. 72 y.o. male is here because of anemia and leukopenia.  This is a pleasant 72 year old male patient who goes by brother Lesperance referred to hematology for evaluation of pancytopenia  Interval History Mr Naba is here after 2 yrs. He says the primary care MD Dr Sherryll Burger wanted him to go back to hematology. He is feeling well. No fevers, drenching night sweats, loss of weight or loss appetite. No change in breathing. No change in bowel habits. No change in urinary habits. No recent hospitalizations.  Rest of the pertinent 10 point ROS reviewed and negative.   MEDICAL HISTORY:  Past Medical History:  Diagnosis Date   Anemia    GERD (gastroesophageal reflux disease)    History of bleeding ulcers    x3   Hyperlipidemia    Hypertension    Inguinal hernia     SURGICAL HISTORY: Past Surgical History:  Procedure Laterality Date   EYE SURGERY     x7   GANGLION CYST EXCISION     left hand   INGUINAL HERNIA REPAIR Right 12/27/2014   Procedure: LAPAROSCOPIC RIGHT INGUINAL HERNIA REPAIR WITH MESH;  Surgeon: Gaynelle Adu, MD;  Location: [facility];  Service: General;  Laterality: Right;   INSERTION OF MESH Right 12/27/2014   Procedure: INSERTION OF MESH;  Surgeon: Gaynelle Adu, MD;  Location: [facility];  Service: General;  Laterality: Right;    SOCIAL HISTORY: Social History   Socioeconomic History   Marital status: Legally Separated    Spouse name: Not on file   Number of children: Not on file   Years of education: Not on  file   Highest education level: Not on file  Occupational History   Not on file  Tobacco Use   Smoking status: Former    Current packs/day: 0.00    Types: Cigarettes    Quit date: 12/19/1969    Years since quitting: 53.1   Smokeless tobacco: Never  Substance and Sexual Activity   Alcohol use: No    Comment: stopped 1971   Drug use: No   Sexual activity: Not on file  Other Topics Concern   Not on file   Social History Narrative   Not on file   Social Determinants of Health   Financial Resource Strain: Not on file  Food Insecurity: Not on file  Transportation Needs: Not on file  Physical Activity: Not on file  Stress: Not on file  Social Connections: Not on file  Intimate Partner Violence: Not on file    FAMILY HISTORY: No family history on file.  ALLERGIES:  has No Known Allergies.  MEDICATIONS:  Current Outpatient Medications  Medication Sig Dispense Refill   Multiple Vitamin (MULTIVITAMIN WITH MINERALS) TABS tablet Take 1 tablet by mouth daily.     naproxen sodium (ANAPROX) 220 MG tablet Take 220 mg by mouth 2 (two) times daily with a meal.     omeprazole (PRILOSEC) 20 MG capsule Take 20 mg by mouth daily.     predniSONE (DELTASONE) 20 MG tablet Take 20 mg by mouth every morning.     rosuvastatin (CRESTOR) 5 MG tablet Take 5 mg by mouth daily.     tamsulosin (FLOMAX) 0.4 MG CAPS capsule TAKE ONE CAPSULE BY MOUTH EVERY DAY, START TAKING 2 WEEKS BEFORE SURGERY (Patient not taking: Reported on 12/05/2020)  0   valsartan-hydrochlorothiazide (DIOVAN-HCT) 160-12.5 MG tablet Take 1 tablet by mouth daily.     No current facility-administered medications for this visit.   PHYSICAL EXAMINATION: ECOG PERFORMANCE STATUS: 0 - Asymptomatic  Vitals:   01/28/23 0850  BP: 111/65  Pulse: 66  Resp: 16  Temp: 98.4 F (36.9 C)  SpO2: 100%     Filed Weights   01/28/23 0850  Weight: 184 lb 11.2 oz (83.8 kg)     Physical Exam Constitutional:      Appearance: Normal appearance.  HENT:     Head: Normocephalic and atraumatic.  Cardiovascular:     Rate and Rhythm: Normal rate and regular rhythm.  Pulmonary:     Effort: Pulmonary effort is normal.     Breath sounds: Normal breath sounds.  Abdominal:     General: Abdomen is flat. Bowel sounds are normal. There is no distension.     Palpations: There is no mass.     Tenderness: There is no abdominal tenderness.   Musculoskeletal:        General: Normal range of motion.     Cervical back: Normal range of motion and neck supple. No rigidity.  Lymphadenopathy:     Cervical: No cervical adenopathy.  Skin:    General: Skin is warm and dry.  Neurological:     Mental Status: He is alert.      LABORATORY DATA:  I have reviewed the data as listed Lab Results  Component Value Date   WBC 3.5 (L) 12/05/2020   HGB 12.2 (L) 12/05/2020   HCT 36.0 (L) 12/05/2020   MCV 89.1 12/05/2020   PLT 121 (L) 12/05/2020     Chemistry      Component Value Date/Time   NA 141 12/05/2020 0732   K 4.1  12/05/2020 0732   CL 108 12/05/2020 0732   CO2 22 12/05/2020 0732   BUN 31 (H) 12/05/2020 0732   CREATININE 1.91 (H) 12/05/2020 0732      Component Value Date/Time   CALCIUM 9.7 12/05/2020 0732   ALKPHOS 76 12/05/2020 0732   AST 31 12/05/2020 0732   ALT 21 12/05/2020 0732   BILITOT 0.4 12/05/2020 0732     Last labs in September 2022 with white blood cell count of 3500, hemoglobin of 12.2, platelet count of 121,000 No autoimmune diseases.  No evidence of hypothyroidism, hemolysis.  Hepatitis panel is nonreactive.  RADIOGRAPHIC STUDIES:  I have personally reviewed the radiological images as listed and agreed with the findings in the report. No results found.   DIAGNOSIS:   BONE MARROW, ASPIRATE, CLOT, CORE:  -Trilineage hematopoiesis with apparent slight plasmacytosis  -See comment   PERIPHERAL BLOOD:  -Normocytic-normochromic anemia  -Neutropenia  -Thrombocytopenia   COMMENT:   The bone marrow material is generally limited but shows trilineage  hematopoiesis with progressive maturation of all myeloid cell lines with  no increase in blastic cells.  There are mild nonspecific changes  particularly involving the erythroid cell line.  Features diagnostic of  a myeloid neoplasm are not seen.  There is an apparent slight increase  in plasma cells (5%) with lack of large aggregates or sheets.  To  assess  plasma cell clonality, CD138, and in situ hybridization for kappa and  lambda will be performed and the results reported in an addendum.  Correlation with cytogenetic studies is recommended.  Cytogenetic showed male karyotype.  All questions were answered. The patient knows to call the clinic with any problems, questions or concerns. I spent 20 minutes in the care of this patient including history, review of records, counseling and coordination of care.     Rachel Moulds, MD 01/28/2023 8:56 AM

## 2023-01-29 ENCOUNTER — Telehealth: Payer: Self-pay | Admitting: Hematology and Oncology

## 2023-01-29 NOTE — Telephone Encounter (Signed)
Spoke with patient confirming upcoming appointment  

## 2023-08-17 ENCOUNTER — Other Ambulatory Visit: Payer: Self-pay | Admitting: Family

## 2023-08-17 DIAGNOSIS — Z87891 Personal history of nicotine dependence: Secondary | ICD-10-CM

## 2023-08-26 ENCOUNTER — Ambulatory Visit
Admission: RE | Admit: 2023-08-26 | Discharge: 2023-08-26 | Disposition: A | Discharge status: Home / Self Care | Source: Ambulatory Visit | Attending: Family | Admitting: Family

## 2023-08-26 DIAGNOSIS — Z87891 Personal history of nicotine dependence: Secondary | ICD-10-CM

## 2024-01-28 ENCOUNTER — Telehealth: Payer: Self-pay | Admitting: Adult Health

## 2024-01-28 NOTE — Telephone Encounter (Signed)
 Patient called in to reschedule appointment with Morna Kendall from 02/08/2024 to 02/22/2024. Patient aware of date/time.

## 2024-01-31 ENCOUNTER — Other Ambulatory Visit: Payer: Medicare Other

## 2024-01-31 ENCOUNTER — Ambulatory Visit: Payer: Medicare Other | Admitting: Adult Health

## 2024-02-08 ENCOUNTER — Inpatient Hospital Stay: Admitting: Adult Health

## 2024-02-08 ENCOUNTER — Inpatient Hospital Stay

## 2024-02-09 ENCOUNTER — Telehealth: Payer: Self-pay | Admitting: Hematology and Oncology

## 2024-02-09 NOTE — Telephone Encounter (Signed)
 I spoke with patient to reschedule appointments with Morna Kendall from 02/22/2024 to 04/25/2024. Patient aware of date/time change.

## 2024-02-22 ENCOUNTER — Inpatient Hospital Stay

## 2024-02-22 ENCOUNTER — Inpatient Hospital Stay: Admitting: Adult Health

## 2024-04-05 ENCOUNTER — Telehealth: Payer: Self-pay | Admitting: Adult Health

## 2024-04-05 NOTE — Telephone Encounter (Signed)
 I spoke with patient as he called in to reschedule lab and NP appointments from 04/25/2024 to 05/05/2024.

## 2024-04-25 ENCOUNTER — Inpatient Hospital Stay

## 2024-04-25 ENCOUNTER — Inpatient Hospital Stay: Admitting: Adult Health

## 2024-05-05 ENCOUNTER — Inpatient Hospital Stay: Admitting: Adult Health

## 2024-05-05 ENCOUNTER — Inpatient Hospital Stay
